# Patient Record
Sex: Female | Born: 1970 | Race: Black or African American | Hispanic: No | Marital: Single | Smoking: Current every day smoker
Health system: Southern US, Community
[De-identification: ages and names within clinical notes are randomized; demographics above are authoritative.]

## PROBLEM LIST (undated history)

## (undated) DIAGNOSIS — G47419 Narcolepsy without cataplexy: Secondary | ICD-10-CM

## (undated) DIAGNOSIS — F988 Other specified behavioral and emotional disorders with onset usually occurring in childhood and adolescence: Secondary | ICD-10-CM

## (undated) DIAGNOSIS — I10 Essential (primary) hypertension: Secondary | ICD-10-CM

## (undated) DIAGNOSIS — C801 Malignant (primary) neoplasm, unspecified: Secondary | ICD-10-CM

## (undated) HISTORY — PX: APPENDECTOMY: SHX54

## (undated) HISTORY — DX: Essential (primary) hypertension: I10

---

## 2010-11-14 HISTORY — PX: OTHER SURGICAL HISTORY: SHX169

## 2019-11-11 ENCOUNTER — Other Ambulatory Visit: Payer: Self-pay

## 2019-11-11 ENCOUNTER — Other Ambulatory Visit (HOSPITAL_COMMUNITY): Payer: Self-pay | Admitting: Psychiatry

## 2019-11-11 ENCOUNTER — Ambulatory Visit (INDEPENDENT_AMBULATORY_CARE_PROVIDER_SITE_OTHER): Payer: 59 | Admitting: Psychiatry

## 2019-11-11 DIAGNOSIS — F901 Attention-deficit hyperactivity disorder, predominantly hyperactive type: Secondary | ICD-10-CM | POA: Diagnosis not present

## 2019-11-11 DIAGNOSIS — F4310 Post-traumatic stress disorder, unspecified: Secondary | ICD-10-CM

## 2019-11-11 MED ORDER — SERTRALINE HCL 50 MG PO TABS: ORAL_TABLET | ORAL | 2 refills | Status: DC

## 2019-11-11 MED ORDER — AMPHETAMINE-DEXTROAMPHETAMINE 30 MG PO TABS: 30.0000 mg | ORAL_TABLET | Freq: Three times a day (TID) | ORAL | 0 refills | Status: DC
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
## 2019-11-25 ENCOUNTER — Ambulatory Visit (HOSPITAL_COMMUNITY): Payer: 59 | Admitting: Psychiatry

## 2019-11-25 ENCOUNTER — Other Ambulatory Visit: Payer: Self-pay

## 2019-12-09 ENCOUNTER — Ambulatory Visit (INDEPENDENT_AMBULATORY_CARE_PROVIDER_SITE_OTHER): Payer: 59 | Admitting: Psychiatry

## 2019-12-09 ENCOUNTER — Other Ambulatory Visit: Payer: Self-pay

## 2019-12-09 DIAGNOSIS — F901 Attention-deficit hyperactivity disorder, predominantly hyperactive type: Secondary | ICD-10-CM | POA: Diagnosis not present

## 2019-12-09 DIAGNOSIS — F4310 Post-traumatic stress disorder, unspecified: Secondary | ICD-10-CM | POA: Diagnosis not present

## 2019-12-09 MED ORDER — SERTRALINE HCL 50 MG PO TABS: ORAL_TABLET | ORAL | 1 refills | Status: DC

## 2019-12-09 MED ORDER — AMPHETAMINE-DEXTROAMPHETAMINE 30 MG PO TABS: 30.0000 mg | ORAL_TABLET | Freq: Three times a day (TID) | ORAL | 0 refills | Status: DC

## 2019-12-09 NOTE — Progress Notes (Signed)
BH MD/PA/NP OP Progress Note  12/09/2019 3:47 PM Toni Dougherty  MRN:  [id number]  Chief Complaint: Attention deficit hyperactivity disorder, posttraumatic stress disorder. HPI: Patient is seen and examined.  Patient is a 49 year old female with the above-stated past psychiatric history who is seen in follow-up.  This is a telephone visit today, of which greater than 50% of the time was spent on direct patient care and evaluation.  Patient stated that she is under an enormous amount of stress currently.  Her father is ill in [place], and it sounds like he is in end-of-life care.  Additionally her sister has been ill with an unspecified form of cancer, and now they have removed treatment.  This causes her great deal distress.  She stated that she is now "working into time zones".  She stated initially that the Zoloft had benefit, and was helping calm her down.  Since these issues have occurred with her family members it does not seem to be as effective.  With regard to the stimulants, she stated that previously she realized that she had been prescribed extended release Adderall, and that one of the reasons why she was so agitated was because of the massive dose of the stimulant she had been receiving.  She stated that the 30 mg a 3 times a day of the short acting Adderall was good, and she has been able to focus on her work and get things done.  She denied any major problems with her sleep from the medication.  She was a bit tearful today, but not suicidal.  This is sadness from the stress related to the fact that her family is not doing well, and that they are in [place], and with travel restrictions she is unable to go there to see them.  She denied any side effects to her current medications. Visit Diagnosis: No diagnosis found.  Past Psychiatric History: See initial evaluation.  Past Medical History: No past medical history on file.   Family Psychiatric History: See initial evaluation.  Family History:  No family history on file.  Social History:  Social History   Socioeconomic History  . Marital status: Single    Spouse name: Not on file  . Number of children: Not on file  . Years of education: Not on file  . Highest education level: Not on file  Occupational History  . Not on file  Tobacco Use  . Smoking status: Not on file  Substance and Sexual Activity  . Alcohol use: Not on file  . Drug use: Not on file  . Sexual activity: Not on file  Other Topics Concern  . Not on file  Social History Narrative  . Not on file   Social Determinants of Health   Financial Resource Strain:   . Difficulty of Paying Living Expenses: Not on file  Food Insecurity:   . Worried About Programme researcher, broadcasting/film/video in the Last Year: Not on file  . Ran Out of Food in the Last Year: Not on file  Transportation Needs:   . Lack of Transportation (Medical): Not on file  . Lack of Transportation (Non-Medical): Not on file  Physical Activity:   . Days of Exercise per Week: Not on file  . Minutes of Exercise per Session: Not on file  Stress:   . Feeling of Stress : Not on file  Social Connections:   . Frequency of Communication with Friends and Family: Not on file  . Frequency of Social Gatherings with Friends  and Family: Not on file  . Attends Religious Services: Not on file  . Active Member of Clubs or Organizations: Not on file  . Attends Archivist Meetings: Not on file  . Marital Status: Not on file    Allergies: Not on File  Metabolic Disorder Labs: No results found for: HGBA1C, MPG No results found for: PROLACTIN No results found for: CHOL, TRIG, HDL, CHOLHDL, VLDL, LDLCALC No results found for: TSH  Therapeutic Level Labs: No results found for: LITHIUM No results found for: VALPROATE No components found for:  CBMZ  Current Medications: Current Outpatient Medications  Medication Sig Dispense Refill  . amphetamine-dextroamphetamine (ADDERALL) 30 MG tablet Take 1 tablet by  mouth every 8 (eight) hours. 90 tablet 0  . sertraline (ZOLOFT) 50 MG tablet 1 tab po q day x 7 days then 1 1/2 tabs po q day 60 tablet 1   No current facility-administered medications for this visit.     Musculoskeletal: Strength & Muscle Tone: NA Gait & Station: NA Patient leans: N/A  Psychiatric Specialty Exam: Review of Systems  There were no vitals taken for this visit.There is no height or weight on file to calculate BMI.  General Appearance: NA  Eye Contact:  NA  Speech:  Normal Rate  Volume:  Normal  Mood:  Anxious and Depressed  Affect:  Congruent  Thought Process:  Coherent and Descriptions of Associations: Intact  Orientation:  Full (Time, Place, and Person)  Thought Content: Logical   Suicidal Thoughts:  No  Homicidal Thoughts:  No  Memory:  Immediate;   Good Recent;   Good Remote;   Fair  Judgement:  Intact  Insight:  Fair  Psychomotor Activity:  NA  Concentration:  Concentration: Fair and Attention Span: Fair  Recall:  [employer] of Knowledge: Fair  Language: Good  Akathisia:  Negative  Handed:  Right  AIMS (if indicated): not done  Assets:  Communication Skills Desire for Improvement Housing Resilience  ADL's:  Intact  Cognition: WNL  Sleep:  Fair   Screenings:   Assessment and Plan: Patient is seen and examined.  Patient is a 49 year old female with the above-stated past psychiatric history who is seen in follow-up.  Her depression and anxiety are worse, most presumably due to the family difficulties that are going on.  I will increase her dosage of Zoloft to 50 mg p.o. daily x7 days, then increase it to 75 mg p.o. daily.  We will see how she tolerates that.  She continues on Adderall 30 mg p.o. 3 times daily, and I will continue that dosage.  I did review the PMP database today, and my prescription is the only controlled substance she is currently receiving.  I will see her back in somewhere between 2 to 4 weeks given her family stressors.  1.   Continue Adderall 30 mg p.o. 3 times daily for attention and focus. 2.  Increase Zoloft to 50 mg p.o. daily for 7 days then increase to 75 mg p.o. daily for depression and anxiety. 3.  Will rerequest old records from her providers in [place]. 4.  Return to clinic in 2 to 4 weeks.   Sharma Covert, MD 12/09/2019, 3:47 PM

## 2019-12-20 ENCOUNTER — Telehealth (HOSPITAL_COMMUNITY): Payer: Self-pay | Admitting: *Deleted

## 2019-12-20 NOTE — Telephone Encounter (Signed)
Pharmacy request for prior authorization of Amphetamine-Dextroamphetamine 30mg . Submitted online with cover my meds. Approved with .

## 2019-12-24 ENCOUNTER — Telehealth (HOSPITAL_COMMUNITY): Payer: Self-pay

## 2019-12-24 NOTE — Telephone Encounter (Signed)
[employer] PRESCRIPTION COVERAGE APPROVED  AMPHETAMINE-DEXTROAMPHETAMINE 30MG  TABLET REFERENCE#  EFFECTIVE 12/20/2019 TO 12/19/2020

## 2020-01-02 ENCOUNTER — Other Ambulatory Visit (HOSPITAL_COMMUNITY): Payer: Self-pay | Admitting: Psychiatry

## 2020-01-02 MED ORDER — AMPHETAMINE-DEXTROAMPHETAMINE 30 MG PO TABS: 30.0000 mg | ORAL_TABLET | Freq: Three times a day (TID) | ORAL | 0 refills | Status: DC

## 2020-01-06 ENCOUNTER — Other Ambulatory Visit: Payer: Self-pay

## 2020-01-06 ENCOUNTER — Ambulatory Visit (INDEPENDENT_AMBULATORY_CARE_PROVIDER_SITE_OTHER): Payer: 59 | Admitting: Psychiatry

## 2020-01-06 DIAGNOSIS — F901 Attention-deficit hyperactivity disorder, predominantly hyperactive type: Secondary | ICD-10-CM

## 2020-01-06 DIAGNOSIS — F329 Major depressive disorder, single episode, unspecified: Secondary | ICD-10-CM

## 2020-01-06 DIAGNOSIS — F4310 Post-traumatic stress disorder, unspecified: Secondary | ICD-10-CM

## 2020-01-06 DIAGNOSIS — F419 Anxiety disorder, unspecified: Secondary | ICD-10-CM

## 2020-01-06 MED ORDER — SERTRALINE HCL 50 MG PO TABS: ORAL_TABLET | ORAL | 4 refills | Status: DC

## 2020-01-06 NOTE — Progress Notes (Signed)
BH MD/PA/NP OP Progress Note  01/06/2020 4:10 PM Toni Dougherty  MRN:  [id number]  Chief Complaint:  Attention deficit hyperactivity disorder, posttraumatic stress disorder. HPI: Patient is seen and examined.  Patient is a 49 year old female with the above-stated past psychiatric history who is seen in follow-up.  This is a telephone visit today, of which greater than 50% of the time was spent on direct patient care and evaluation.  Patient stated she is doing about 70% better than on her first evaluation.  Her father did die, and she is unable to go to [place] for the funeral, but she is going to be able to see the funeral by Zoom call.  She stated that she is having some tearfulness about the death of her father, but that her crying spells from the initial evaluation have decreased significantly.  She had some issues with getting the Adderall approved, but her insurance company approve that and she got the current dosage proximately a week or so ago.  She stated its helping significantly with her work.  She denied any helplessness, hopelessness or worthlessness.  She stated her focus and attention is doing well.  She denied any problems with sleep or other side effects to the stimulants.  She did state that she still just taking 50 mg of the Zoloft a day, and we discussed the possibility of increasing it to 75.  She did state that she had some mild nausea taking the Zoloft, but when she drank a good glass of water or 2 with it that the nausea resolved. Visit Diagnosis: No diagnosis found.  Past Psychiatric History: See initial evaluation.  Past Medical History: No past medical history on file.   Family Psychiatric History: See initial evaluation.  Family History: No family history on file.  Social History:  Social History   Socioeconomic History  . Marital status: Single    Spouse name: Not on file  . Number of children: Not on file  . Years of education: Not on file  . Highest education level:  Not on file  Occupational History  . Not on file  Tobacco Use  . Smoking status: Not on file  Substance and Sexual Activity  . Alcohol use: Not on file  . Drug use: Not on file  . Sexual activity: Not on file  Other Topics Concern  . Not on file  Social History Narrative  . Not on file   Social Determinants of Health   Financial Resource Strain:   . Difficulty of Paying Living Expenses: Not on file  Food Insecurity:   . Worried About Charity fundraiser in the Last Year: Not on file  . Ran Out of Food in the Last Year: Not on file  Transportation Needs:   . Lack of Transportation (Medical): Not on file  . Lack of Transportation (Non-Medical): Not on file  Physical Activity:   . Days of Exercise per Week: Not on file  . Minutes of Exercise per Session: Not on file  Stress:   . Feeling of Stress : Not on file  Social Connections:   . Frequency of Communication with Friends and Family: Not on file  . Frequency of Social Gatherings with Friends and Family: Not on file  . Attends Religious Services: Not on file  . Active Member of Clubs or Organizations: Not on file  . Attends Archivist Meetings: Not on file  . Marital Status: Not on file    Allergies: Not on File  Metabolic Disorder Labs: No results found for: HGBA1C, MPG No results found for: PROLACTIN No results found for: CHOL, TRIG, HDL, CHOLHDL, VLDL, LDLCALC No results found for: TSH  Therapeutic Level Labs: No results found for: LITHIUM No results found for: VALPROATE No components found for:  CBMZ  Current Medications: Current Outpatient Medications  Medication Sig Dispense Refill  . amphetamine-dextroamphetamine (ADDERALL) 30 MG tablet Take 1 tablet by mouth every 8 (eight) hours. 90 tablet 0  . sertraline (ZOLOFT) 50 MG tablet 1 1/2 tabs po q day 45 tablet 4   No current facility-administered medications for this visit.     Musculoskeletal: Strength & Muscle Tone: NA Gait & Station:  NA Patient leans: N/A  Psychiatric Specialty Exam: Review of Systems  Constitutional: Negative.   HENT: Negative.   Eyes: Negative.   Respiratory: Negative.   Cardiovascular: Negative.   Gastrointestinal: Positive for nausea.  Endocrine: Negative.   Genitourinary: Negative.   Musculoskeletal: Negative.   Skin: Negative.   Allergic/Immunologic: Negative.   Neurological: Negative.   Hematological: Negative.   Psychiatric/Behavioral: Positive for decreased concentration. The patient is nervous/anxious.   All other systems reviewed and are negative.   There were no vitals taken for this visit.There is no height or weight on file to calculate BMI.  General Appearance: NA  Eye Contact:  NA  Speech:  Normal Rate  Volume:  Normal  Mood:  Anxious  Affect:  Congruent  Thought Process:  Coherent  Orientation:  Full (Time, Place, and Person)  Thought Content: Logical   Suicidal Thoughts:  No  Homicidal Thoughts:  No  Memory:  Immediate;   Good Recent;   Good Remote;   Good  Judgement:  Intact  Insight:  Good  Psychomotor Activity:  NA  Concentration:  Concentration: Good and Attention Span: Good  Recall:  Good  Fund of Knowledge: Good  Language: Good  Akathisia:  Negative  Handed:  Right  AIMS (if indicated): not done  Assets:  Communication Skills Desire for Improvement Housing Resilience Talents/Skills  ADL's:  Intact  Cognition: WNL  Sleep:  Good   Screenings:   Assessment and Plan: Patient is seen and examined.  Patient is a 49 year old female with the above-stated past psychiatric history who is seen in follow-up.  This is a telephone visit today of which greater than 50% of the time was spent in direct patient care and evaluation.  She is actually coping fairly well with the death of her father.  She is having some degree of tearfulness, but her crying spells have decreased since starting the Zoloft..  She feels as though she is about 70% better than she was in the  first evaluation.  We discussed possibly increasing the Zoloft to 75 mg p.o. daily, number to go on and take care of that today.  She continues on the Adderall and doing much better than before.  Her previous provider had written for the extended release 30 mg 3 times a day which was a massive dosage.  She is tolerating the short acting much better.  She is not having any side effects outside of some mild nausea with the Zoloft.  No other changes to her treatment, and I will see her back somewhere between 3 to 4 months.  1.  Continue Adderall 30 mg p.o. 3 times daily for attention and focus. 2.  Increase Zoloft to 75 mg p.o. daily for anxiety and depression. 3.  Return to clinic in 3 to 4 months.  [name], MD 01/06/2020, 4:10 PM

## 2020-02-11 ENCOUNTER — Other Ambulatory Visit (HOSPITAL_COMMUNITY): Payer: Self-pay | Admitting: Psychiatry

## 2020-02-11 ENCOUNTER — Other Ambulatory Visit: Payer: Self-pay | Admitting: Psychiatry

## 2020-02-11 ENCOUNTER — Telehealth (HOSPITAL_COMMUNITY): Payer: Self-pay | Admitting: *Deleted

## 2020-02-11 MED ORDER — AMPHETAMINE-DEXTROAMPHETAMINE 30 MG PO TABS: 30.0000 mg | ORAL_TABLET | Freq: Three times a day (TID) | ORAL | 0 refills | Status: DC

## 2020-02-11 MED ORDER — SERTRALINE HCL 50 MG PO TABS: ORAL_TABLET | ORAL | 4 refills | Status: DC

## 2020-02-11 MED ORDER — SERTRALINE HCL 100 MG PO TABS: 150.0000 mg | ORAL_TABLET | Freq: Every day | ORAL | 4 refills | Status: DC

## 2020-02-11 NOTE — Telephone Encounter (Signed)
Received refill request for Adderall 30 mg and Zoloft 50mg . Last written 01/02/20. Pt has an appointment on 05/04/20. Please review.

## 2020-03-04 ENCOUNTER — Other Ambulatory Visit (HOSPITAL_COMMUNITY): Payer: Self-pay | Admitting: Psychiatry

## 2020-04-02 ENCOUNTER — Other Ambulatory Visit (HOSPITAL_COMMUNITY): Payer: Self-pay | Admitting: Psychiatry

## 2020-04-02 MED ORDER — AMPHETAMINE-DEXTROAMPHETAMINE 30 MG PO TABS: 30.0000 mg | ORAL_TABLET | Freq: Three times a day (TID) | ORAL | 0 refills | Status: DC

## 2020-04-29 ENCOUNTER — Other Ambulatory Visit: Payer: Self-pay

## 2020-04-29 ENCOUNTER — Encounter (HOSPITAL_COMMUNITY): Payer: Self-pay

## 2020-04-29 DIAGNOSIS — M791 Myalgia, unspecified site: Secondary | ICD-10-CM | POA: Insufficient documentation

## 2020-04-29 DIAGNOSIS — R05 Cough: Secondary | ICD-10-CM | POA: Insufficient documentation

## 2020-04-29 DIAGNOSIS — F17210 Nicotine dependence, cigarettes, uncomplicated: Secondary | ICD-10-CM | POA: Insufficient documentation

## 2020-04-29 DIAGNOSIS — J029 Acute pharyngitis, unspecified: Secondary | ICD-10-CM | POA: Insufficient documentation

## 2020-04-29 DIAGNOSIS — R5383 Other fatigue: Secondary | ICD-10-CM | POA: Diagnosis not present

## 2020-04-29 DIAGNOSIS — Z20822 Contact with and (suspected) exposure to covid-19: Secondary | ICD-10-CM | POA: Diagnosis not present

## 2020-04-29 DIAGNOSIS — R52 Pain, unspecified: Secondary | ICD-10-CM | POA: Insufficient documentation

## 2020-04-29 NOTE — ED Triage Notes (Signed)
Arrived POV. Patient reports flulike symptoms since Friday cough, headache. Patient states she has not taken any OTC meds at home "because I generally just don;t take medications". NAD

## 2020-04-30 ENCOUNTER — Emergency Department (HOSPITAL_COMMUNITY): Payer: 59

## 2020-04-30 ENCOUNTER — Emergency Department (HOSPITAL_COMMUNITY)
Admission: EM | Admit: 2020-04-30 | Discharge: 2020-04-30 | Disposition: A | Discharge status: Home / Self Care | Payer: 59 | Attending: Emergency Medicine | Admitting: Emergency Medicine

## 2020-04-30 DIAGNOSIS — R52 Pain, unspecified: Secondary | ICD-10-CM

## 2020-04-30 DIAGNOSIS — R059 Cough, unspecified: Secondary | ICD-10-CM

## 2020-04-30 LAB — SARS CORONAVIRUS 2 (TAT 6-24 HRS): SARS Coronavirus 2: NEGATIVE

## 2020-04-30 MED ORDER — BENZONATATE 100 MG PO CAPS
100.0000 mg | ORAL_CAPSULE | Freq: Three times a day (TID) | ORAL | 0 refills | Status: DC
Start: 2020-04-30 — End: 2022-09-23

## 2020-04-30 NOTE — ED Provider Notes (Signed)
[facility]-EMERGENCY DEPT Provider Note   CSN: [id number] Arrival date & time: 04/29/20  2144     History Chief Complaint  Patient presents with  . Flu symptoms    Toni Dougherty is a 49 y.o. female.  The history is provided by the patient and medical records.   50 year old female presenting to the ED with flulike symptoms.  She reports generalized body aches, headache, fatigue, sore throat, and productive cough for the past 3 days.  States sputum has been somewhat of a yellow color.  She is unsure of any sick contacts or known Covid exposures, she is currently living in a hotel room.  She reports intermittent fevers, afebrile here.  She has not tried any medications for her symptoms as she states "I does like taking medicine".  History reviewed. No pertinent past medical history.  There are no problems to display for this patient.   History reviewed. No pertinent surgical history.   OB History   No obstetric history on file.     No family history on file.  Social History   Tobacco Use  . Smoking status: Current Every Day Smoker    Packs/day: 0.25    Types: Cigarettes  . Smokeless tobacco: Never Used  Substance Use Topics  . Alcohol use: Yes  . Drug use: Not on file    Home Medications Prior to Admission medications   Medication Sig Start Date End Date Taking? Authorizing Provider  amphetamine-dextroamphetamine (ADDERALL) 30 MG tablet Take 1 tablet by mouth 3 (three) times daily. 04/02/20 04/02/21  [name], MD  sertraline (ZOLOFT) 100 MG tablet TAKE 1.5 TABLETS BY MOUTH DAILY 03/04/20   [name], MD    Allergies    Patient has no known allergies.  Review of Systems   Review of Systems  Constitutional: Positive for fatigue.  Respiratory: Positive for cough.   Musculoskeletal: Positive for myalgias.  All other systems reviewed and are negative.   Physical Exam Updated Vital Signs BP (!) 149/95 (BP Location: Left Arm)    Pulse 86   Temp 98.4 F (36.9 C) (Oral)   Resp 18   Ht 6' (1.829 m)   Wt 99.8 kg   LMP 04/28/2020   SpO2 96%   BMI 29.84 kg/m   Physical Exam Vitals and nursing note reviewed.  Constitutional:      Appearance: She is well-developed.  HENT:     Head: Normocephalic and atraumatic.  Eyes:     Conjunctiva/sclera: Conjunctivae normal.     Pupils: Pupils are equal, round, and reactive to light.  Cardiovascular:     Rate and Rhythm: Normal rate and regular rhythm.     Heart sounds: Normal heart sounds.  Pulmonary:     Effort: Pulmonary effort is normal. No respiratory distress.     Breath sounds: Normal breath sounds. No wheezing or rhonchi.     Comments: Lungs clear, no distress, able to speak in sentences without difficulty, no observed cough Abdominal:     General: Bowel sounds are normal.     Palpations: Abdomen is soft.     Tenderness: There is no abdominal tenderness. There is no rebound.  Musculoskeletal:        General: Normal range of motion.     Cervical back: Normal range of motion.  Skin:    General: Skin is warm and dry.  Neurological:     Mental Status: She is alert and oriented to person, place, and time.  ED Results / Procedures / Treatments   Labs (all labs ordered are listed, but only abnormal results are displayed) Labs Reviewed  SARS CORONAVIRUS 2 (TAT 6-24 HRS)    EKG None  Radiology DG Chest Port 1 View  Result Date: 04/30/2020 CLINICAL DATA:  Cough, fever EXAM: PORTABLE CHEST 1 VIEW COMPARISON:  None. FINDINGS: The heart size and mediastinal contours are within normal limits. Both lungs are clear. The visualized skeletal structures are unremarkable. IMPRESSION: No active disease. Electronically Signed   By: Prudencio Pair M.D.   On: 04/30/2020 02:55    Procedures Procedures (including critical care time)  Medications Ordered in ED Medications - No data to display  ED Course  I have reviewed the triage vital signs and the nursing  notes.  Pertinent labs & imaging results that were available during my care of the patient were reviewed by me and considered in my medical decision making (see chart for details).    MDM Rules/Calculators/A&P  49 year old female presenting to the ED with reported flulike symptoms over the past several days.  She reports headache, generalized body aches, sore throat, fatigue, and productive cough.  She is afebrile and nontoxic in appearance here.  She has no acute respiratory distress, lungs are clear bilaterally.  She reports she has been living in a hotel, unsure of sick contacts or noted Covid exposures.  Chest x-ray here without any acute findings.  Covid swab has been sent.  Patient advised to take Tylenol or Motrin as needed for headache/body aches/fever.  She was given Tessalon for cough.  She'll be notified if Covid test is positive, if so she'll need to quarantine for 14 days.  She can follow-up with PCP.  Return here for any new or acute changes.  Final Clinical Impression(s) / ED Diagnoses Final diagnoses:  Cough  Body aches    Rx / DC Orders ED Discharge Orders         Ordered    benzonatate (TESSALON) 100 MG capsule  Every 8 hours     Discontinue  Reprint     04/30/20 Kennedy, Fergie Sherbert M, PA-C 04/30/20 0435    Palumbo, April, MD 04/30/20 5102

## 2020-04-30 NOTE — Discharge Instructions (Signed)
Chest x-ray was normal.  You will be contacted if covid test is positive. Recommend tylenol or motrin for headache/fever/body aches.   Can take the tessalon for cough. Try to rest, drink lots of fluids. Return here for any new/acute changes.

## 2020-05-04 ENCOUNTER — Other Ambulatory Visit: Payer: Self-pay

## 2020-05-04 ENCOUNTER — Ambulatory Visit (HOSPITAL_COMMUNITY): Payer: 59 | Admitting: Psychiatry

## 2020-06-01 ENCOUNTER — Telehealth (HOSPITAL_COMMUNITY): Payer: 59 | Admitting: Psychiatry

## 2020-06-02 ENCOUNTER — Telehealth (HOSPITAL_COMMUNITY): Payer: 59 | Admitting: Psychiatry

## 2020-06-02 ENCOUNTER — Other Ambulatory Visit: Payer: Self-pay

## 2020-06-08 ENCOUNTER — Other Ambulatory Visit: Payer: Self-pay

## 2020-06-08 ENCOUNTER — Telehealth (HOSPITAL_COMMUNITY): Payer: 59 | Admitting: Psychiatry

## 2020-06-08 NOTE — Progress Notes (Unsigned)
BH MD/PA/NP OP Progress Note  06/08/2020 2:58 PM Toni Dougherty  MRN:  [id number]  Chief Complaint:  HPI: *** Visit Diagnosis: No diagnosis found.  Past Psychiatric History: ***  Past Medical History: No past medical history on file. No past surgical history on file.  Family Psychiatric History: ***  Family History: No family history on file.  Social History:  Social History   Socioeconomic History  . Marital status: Single    Spouse name: Not on file  . Number of children: Not on file  . Years of education: Not on file  . Highest education level: Not on file  Occupational History  . Not on file  Tobacco Use  . Smoking status: Current Every Day Smoker    Packs/day: 0.25    Types: Cigarettes  . Smokeless tobacco: Never Used  Substance and Sexual Activity  . Alcohol use: Yes  . Drug use: Not on file  . Sexual activity: Not on file  Other Topics Concern  . Not on file  Social History Narrative  . Not on file   Social Determinants of Health   Financial Resource Strain:   . Difficulty of Paying Living Expenses:   Food Insecurity:   . Worried About Programme researcher, broadcasting/film/video in the Last Year:   . Barista in the Last Year:   Transportation Needs:   . Freight forwarder (Medical):   Marland Kitchen Lack of Transportation (Non-Medical):   Physical Activity:   . Days of Exercise per Week:   . Minutes of Exercise per Session:   Stress:   . Feeling of Stress :   Social Connections:   . Frequency of Communication with Friends and Family:   . Frequency of Social Gatherings with Friends and Family:   . Attends Religious Services:   . Active Member of Clubs or Organizations:   . Attends Banker Meetings:   Marland Kitchen Marital Status:     Allergies: No Known Allergies  Metabolic Disorder Labs: No results found for: HGBA1C, MPG No results found for: PROLACTIN No results found for: CHOL, TRIG, HDL, CHOLHDL, VLDL, LDLCALC No results found for: TSH  Therapeutic Level  Labs: No results found for: LITHIUM No results found for: VALPROATE No components found for:  CBMZ  Current Medications: Current Outpatient Medications  Medication Sig Dispense Refill  . amphetamine-dextroamphetamine (ADDERALL) 30 MG tablet Take 1 tablet by mouth 3 (three) times daily. 90 tablet 0  . benzonatate (TESSALON) 100 MG capsule Take 1 capsule (100 mg total) by mouth every 8 (eight) hours. 21 capsule 0  . sertraline (ZOLOFT) 100 MG tablet TAKE 1.5 TABLETS BY MOUTH DAILY 135 tablet 2   No current facility-administered medications for this visit.     Musculoskeletal: Strength & Muscle Tone: {desc; muscle tone:32375} Gait & Station: {PE GAIT ED EUMP:53614} Patient leans: {Patient Leans:21022755}  Psychiatric Specialty Exam: Review of Systems  There were no vitals taken for this visit.There is no height or weight on file to calculate BMI.  General Appearance: {Appearance:22683}  Eye Contact:  {BHH EYE CONTACT:22684}  Speech:  {Speech:22685}  Volume:  {Volume (PAA):22686}  Mood:  {BHH MOOD:22306}  Affect:  {Affect (PAA):22687}  Thought Process:  {Thought Process (PAA):22688}  Orientation:  {BHH ORIENTATION (PAA):22689}  Thought Content: {Thought Content:22690}   Suicidal Thoughts:  {ST/HT (PAA):22692}  Homicidal Thoughts:  {ST/HT (PAA):22692}  Memory:  {BHH ERXVQM:08676}  Judgement:  {Judgement (PAA):22694}  Insight:  {Insight (PAA):22695}  Psychomotor Activity:  {Psychomotor (PAA):22696}  Concentration:  {Concentration:21399}  Recall:  {BHH GOOD/FAIR/POOR:22877}  Fund of Knowledge: {BHH GOOD/FAIR/POOR:22877}  Language: {BHH GOOD/FAIR/POOR:22877}  Akathisia:  {BHH YES OR NO:22294}  Handed:  {Handed:22697}  AIMS (if indicated): {Desc; done/not:10129}  Assets:  {Assets (PAA):22698}  ADL's:  {BHH DYJ'W:92957}  Cognition: {chl bhh cognition:304700322}  Sleep:  {BHH GOOD/FAIR/POOR:22877}   Screenings:   Assessment and Plan: ***   Toni Pert,  MD 06/08/2020, 2:58 PM

## 2020-06-26 ENCOUNTER — Emergency Department (HOSPITAL_COMMUNITY): Admission: EM | Admit: 2020-06-26 | Discharge: 2020-06-26 | Discharge status: Against Medical Advice | Payer: 59

## 2020-06-26 NOTE — ED Notes (Addendum)
PT left due to wait

## 2020-07-17 ENCOUNTER — Other Ambulatory Visit (HOSPITAL_COMMUNITY): Payer: Self-pay | Admitting: Psychiatry

## 2020-07-17 MED ORDER — SERTRALINE HCL 100 MG PO TABS: ORAL_TABLET | ORAL | 2 refills | Status: DC

## 2020-07-17 MED ORDER — AMPHETAMINE-DEXTROAMPHETAMINE 30 MG PO TABS: 30.0000 mg | ORAL_TABLET | Freq: Three times a day (TID) | ORAL | 0 refills | Status: DC

## 2020-07-21 ENCOUNTER — Other Ambulatory Visit: Payer: Self-pay

## 2020-07-21 ENCOUNTER — Telehealth (HOSPITAL_COMMUNITY): Payer: 59 | Admitting: Psychiatry

## 2020-07-22 ENCOUNTER — Other Ambulatory Visit: Payer: Self-pay

## 2020-07-22 ENCOUNTER — Telehealth (INDEPENDENT_AMBULATORY_CARE_PROVIDER_SITE_OTHER): Payer: 59 | Admitting: Psychiatry

## 2020-07-22 DIAGNOSIS — F900 Attention-deficit hyperactivity disorder, predominantly inattentive type: Secondary | ICD-10-CM

## 2020-07-22 NOTE — Progress Notes (Signed)
BH MD/PA/NP OP Progress Note  07/22/2020 2:16 PM Toni Dougherty  MRN:  [id number]  Chief Complaint: Attention deficit disorder, posttraumatic stress disorder HPI: Patient is seen and examined. Patient is a 49 year old female with the above-stated past psychiatric history who is seen in a telephone follow-up visit. This is a telephone visit today of which greater than 50% of the 20-minute visit was spent on direct patient care and evaluation. A 2 factor identification process was done. The patient was located in her home, and I was in my office at the hospital. It has been 6 months since she was seen in follow-up. Since that time her father died, her sister died, she was left homeless. That was all 1 to 2 months ago. She now has a home to live then, she is able to pay her rent, and she is still working. She stated she feels like she has to make her life for crisis before she can accomplish anything. Procrastination seems to be a great problem for her. She stated she felt as though the Zoloft was helpful with regard to controlling the anxiety and depression. She stated the stimulants which were restarted 2 to 3 days ago help her focus on accomplishing tasks. She stated she felt as though her previous trauma made her have significant negative self image, and that she was sabotaging herself through all these events. Overall she stated she feels much better now that she had, she denied any side effects to her current medications, and no evidence of any suicidal thinking. 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 Visit Diagnosis: No diagnosis found.  Past Psychiatric History: See initial evaluation.  Past Medical History: No past medical history on file. No past surgical history on file.  Family Psychiatric History: See initial evaluation.  Family History: No family history on file.  Social History:  Social History   Socioeconomic  History   Marital status: Single    Spouse name: Not on file   Number of children: Not on file   Years of education: Not on file   Highest education level: Not on file  Occupational History   Not on file  Tobacco Use   Smoking status: Current Every Day Smoker    Packs/day: 0.25    Types: Cigarettes   Smokeless tobacco: Never Used  Substance and Sexual Activity   Alcohol use: Yes   Drug use: Not on file   Sexual activity: Not on file  Other Topics Concern   Not on file  Social History Narrative   Not on file   Social Determinants of Health   Financial Resource Strain:    Difficulty of Paying Living Expenses: Not on file  Food Insecurity:    Worried About Running Out of Food in the Last Year: Not on file   Ran Out of Food in the Last Year: Not on file  Transportation Needs:    Lack of Transportation (Medical): Not on file   Lack of Transportation (Non-Medical): Not on file  Physical Activity:    Days of Exercise per Week: Not on file   Minutes of Exercise per Session: Not on file  Stress:    Feeling of Stress : Not on file  Social Connections:    Frequency of Communication with Friends and Family: Not on file   Frequency of Social Gatherings with Friends and Family: Not on file   Attends Religious Services: Not on file   Active Member  of Clubs or Organizations: Not on file   Attends Banker Meetings: Not on file   Marital Status: Not on file    Allergies: No Known Allergies  Metabolic Disorder Labs: No results found for: HGBA1C, MPG No results found for: PROLACTIN No results found for: CHOL, TRIG, HDL, CHOLHDL, VLDL, LDLCALC No results found for: TSH  Therapeutic Level Labs: No results found for: LITHIUM No results found for: VALPROATE No components found for:  CBMZ  Current Medications: Current Outpatient Medications  Medication Sig Dispense Refill   amphetamine-dextroamphetamine (ADDERALL) 30 MG tablet Take 1 tablet  by mouth 3 (three) times daily. 90 tablet 0   benzonatate (TESSALON) 100 MG capsule Take 1 capsule (100 mg total) by mouth every 8 (eight) hours. 21 capsule 0   sertraline (ZOLOFT) 100 MG tablet TAKE 1.5 TABLETS BY MOUTH DAILY 135 tablet 2   No current facility-administered medications for this visit.     Musculoskeletal: Strength & Muscle Tone: NA Gait & Station: NA Patient leans: N/A  Psychiatric Specialty Exam: Review of Systems  There were no vitals taken for this visit.There is no height or weight on file to calculate BMI.  General Appearance: NA  Eye Contact:  NA  Speech:  Normal Rate  Volume:  Normal  Mood:  Anxious  Affect:  Congruent  Thought Process:  Coherent and Descriptions of Associations: Intact  Orientation:  Full (Time, Place, and Person)  Thought Content: Logical   Suicidal Thoughts:  No  Homicidal Thoughts:  No  Memory:  Immediate;   Good Recent;   Good Remote;   Good  Judgement:  Intact  Insight:  Fair  Psychomotor Activity:  NA  Concentration:  Concentration: Fair and Attention Span: Fair  Recall:  Good  Fund of Knowledge: Good  Language: Good  Akathisia:  Negative  Handed:  Right  AIMS (if indicated): not done  Assets:  Communication Skills Desire for Improvement Housing Resilience Talents/Skills Vocational/Educational  ADL's:  Intact  Cognition: WNL  Sleep:  Good   Screenings:  Diagnosis: 1. Posttraumatic stress disorder 2. Attention deficit hyperactivity disorder 3. Major depression  Assessment and Plan: Patient is seen and examined. Patient is a 49 year old female with the above-stated past psychiatric history who was seen in follow-up. She was lost to follow-up for several months because of being homeless, but now has a place to stay, is working, and doing much better. No change in her current medications. I will see her back in approximately 3 to 4 months.  Plan: 1. Continue sertraline 75 mg p.o. daily for depression, anxiety  and previous trauma. 2. Continue Adderall 30 mg p.o. twice daily for attention deficit hyperactivity disorder. 3. Return to clinic in 3 to 4 months.   [name], MD 07/22/2020, 2:16 PM

## 2020-08-25 ENCOUNTER — Other Ambulatory Visit (HOSPITAL_COMMUNITY): Payer: Self-pay | Admitting: Psychiatry

## 2020-08-25 MED ORDER — AMPHETAMINE-DEXTROAMPHETAMINE 30 MG PO TABS: 30.0000 mg | ORAL_TABLET | Freq: Three times a day (TID) | ORAL | 0 refills | Status: DC

## 2020-09-28 ENCOUNTER — Other Ambulatory Visit (HOSPITAL_COMMUNITY): Payer: Self-pay | Admitting: Psychiatry

## 2020-09-28 MED ORDER — AMPHETAMINE-DEXTROAMPHETAMINE 30 MG PO TABS: 30.0000 mg | ORAL_TABLET | Freq: Three times a day (TID) | ORAL | 0 refills | Status: DC

## 2020-10-22 ENCOUNTER — Other Ambulatory Visit (HOSPITAL_COMMUNITY): Payer: Self-pay | Admitting: Psychiatry

## 2020-10-22 MED ORDER — AMPHETAMINE-DEXTROAMPHETAMINE 30 MG PO TABS: 30.0000 mg | ORAL_TABLET | Freq: Three times a day (TID) | ORAL | 0 refills | Status: DC

## 2020-10-28 ENCOUNTER — Other Ambulatory Visit (HOSPITAL_COMMUNITY): Payer: Self-pay | Admitting: Psychiatry

## 2020-10-28 MED ORDER — AMPHETAMINE-DEXTROAMPHETAMINE 30 MG PO TABS: 30.0000 mg | ORAL_TABLET | Freq: Three times a day (TID) | ORAL | 0 refills | Status: DC

## 2021-01-29 ENCOUNTER — Telehealth (HOSPITAL_COMMUNITY): Payer: Self-pay | Admitting: *Deleted

## 2021-01-29 ENCOUNTER — Other Ambulatory Visit (HOSPITAL_COMMUNITY): Payer: Self-pay | Admitting: Psychiatry

## 2021-01-29 MED ORDER — SERTRALINE HCL 100 MG PO TABS: ORAL_TABLET | ORAL | 2 refills | Status: DC

## 2021-01-29 MED ORDER — AMPHETAMINE-DEXTROAMPHETAMINE 30 MG PO TABS: 30.0000 mg | ORAL_TABLET | Freq: Three times a day (TID) | ORAL | 0 refills | Status: DC

## 2021-01-29 NOTE — Telephone Encounter (Signed)
Pt called requesting refill of Adderall. Pt has no appointment on the books. Will call with reminder.

## 2021-07-07 IMAGING — DX DG CHEST 1V PORT
1 series · 2 of 2 positions shown · non-contrast
Comparison: None.

CLINICAL DATA: Cough, fever

EXAM:
PORTABLE CHEST 1 VIEW

[Series 1: chest ap · 0.14mm/px · 2 of 2 slices shown]
[im 1/2]
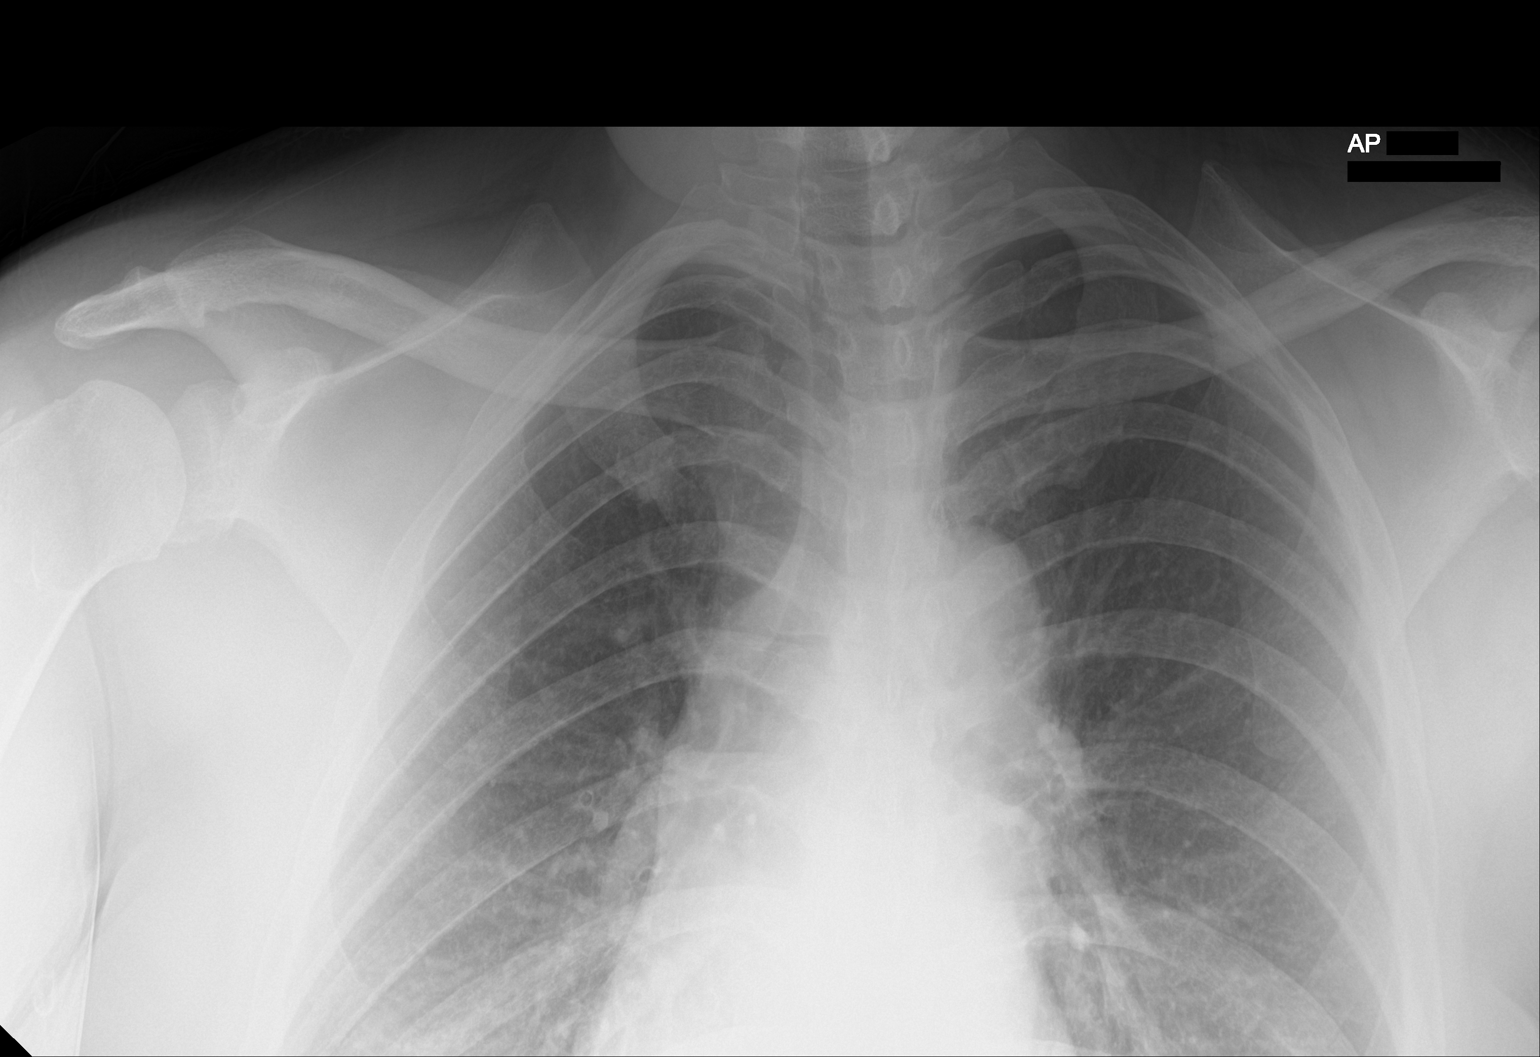
[im 2/2]
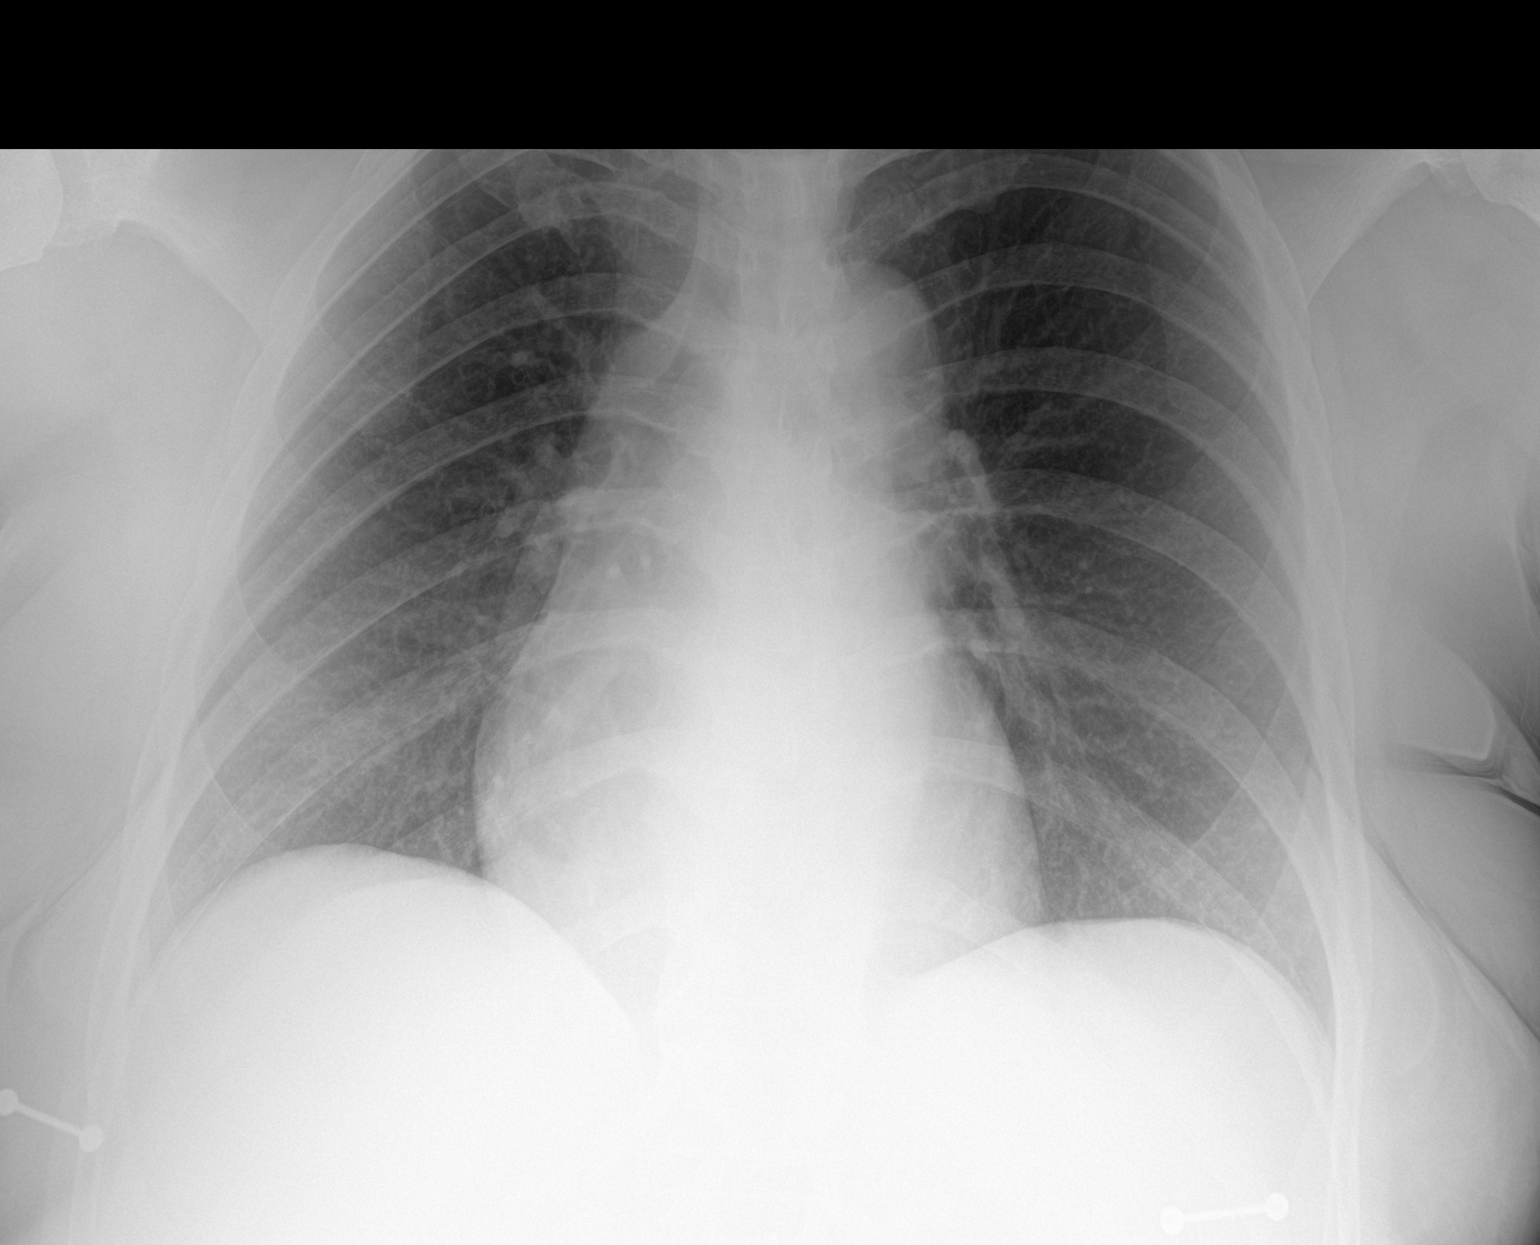

[2 of 2 positions shown; findings below may reference images not displayed]

FINDINGS: The heart size and mediastinal contours are within normal limits.
Both lungs are clear. The visualized skeletal structures are
unremarkable.
IMPRESSION: No active disease.

## 2021-11-06 ENCOUNTER — Other Ambulatory Visit: Payer: Self-pay

## 2021-11-06 ENCOUNTER — Emergency Department (HOSPITAL_BASED_OUTPATIENT_CLINIC_OR_DEPARTMENT_OTHER)
Admission: EM | Admit: 2021-11-06 | Discharge: 2021-11-07 | Disposition: A | Discharge status: Home / Self Care | Payer: Self-pay | Attending: Emergency Medicine | Admitting: Emergency Medicine

## 2021-11-06 ENCOUNTER — Encounter (HOSPITAL_BASED_OUTPATIENT_CLINIC_OR_DEPARTMENT_OTHER): Payer: Self-pay | Admitting: *Deleted

## 2021-11-06 ENCOUNTER — Emergency Department (HOSPITAL_BASED_OUTPATIENT_CLINIC_OR_DEPARTMENT_OTHER): Payer: Self-pay

## 2021-11-06 DIAGNOSIS — Z28310 Unvaccinated for covid-19: Secondary | ICD-10-CM | POA: Insufficient documentation

## 2021-11-06 DIAGNOSIS — H66001 Acute suppurative otitis media without spontaneous rupture of ear drum, right ear: Secondary | ICD-10-CM

## 2021-11-06 DIAGNOSIS — N390 Urinary tract infection, site not specified: Secondary | ICD-10-CM

## 2021-11-06 DIAGNOSIS — B9689 Other specified bacterial agents as the cause of diseases classified elsewhere: Secondary | ICD-10-CM | POA: Insufficient documentation

## 2021-11-06 DIAGNOSIS — F17210 Nicotine dependence, cigarettes, uncomplicated: Secondary | ICD-10-CM | POA: Insufficient documentation

## 2021-11-06 DIAGNOSIS — U071 COVID-19: Secondary | ICD-10-CM

## 2021-11-06 HISTORY — DX: Other specified behavioral and emotional disorders with onset usually occurring in childhood and adolescence: F98.8

## 2021-11-06 HISTORY — DX: Narcolepsy without cataplexy: G47.419

## 2021-11-06 LAB — URINALYSIS, MICROSCOPIC (REFLEX)

## 2021-11-06 LAB — CBC WITH DIFFERENTIAL/PLATELET
Abs Immature Granulocytes: 0.05 10*3/uL (ref 0.00–0.07)
Basophils Absolute: 0 10*3/uL (ref 0.0–0.1)
Basophils Relative: 1 %
Eosinophils Absolute: 0.1 10*3/uL (ref 0.0–0.5)
Eosinophils Relative: 1 %
HCT: 38 % (ref 36.0–46.0)
Hemoglobin: 12.9 g/dL (ref 12.0–15.0)
Immature Granulocytes: 1 %
Lymphocytes Relative: 6 %
Lymphs Abs: 0.4 10*3/uL — ABNORMAL LOW (ref 0.7–4.0)
MCH: 31 pg (ref 26.0–34.0)
MCHC: 33.9 g/dL (ref 30.0–36.0)
MCV: 91.3 fL (ref 80.0–100.0)
Monocytes Absolute: 0.7 10*3/uL (ref 0.1–1.0)
Monocytes Relative: 10 %
Neutro Abs: 6.1 10*3/uL (ref 1.7–7.7)
Neutrophils Relative %: 81 %
Platelets: 203 10*3/uL (ref 150–400)
RBC: 4.16 MIL/uL (ref 3.87–5.11)
RDW: 13.2 % (ref 11.5–15.5)
WBC: 7.4 10*3/uL (ref 4.0–10.5)
nRBC: 0 % (ref 0.0–0.2)

## 2021-11-06 LAB — URINALYSIS, ROUTINE W REFLEX MICROSCOPIC
Bilirubin Urine: NEGATIVE
Glucose, UA: NEGATIVE mg/dL
Ketones, ur: NEGATIVE mg/dL
Leukocytes,Ua: NEGATIVE
Nitrite: NEGATIVE
Protein, ur: NEGATIVE mg/dL
Specific Gravity, Urine: 1.02 (ref 1.005–1.030)
pH: 7 (ref 5.0–8.0)

## 2021-11-06 LAB — COMPREHENSIVE METABOLIC PANEL
ALT: 14 U/L (ref 0–44)
AST: 19 U/L (ref 15–41)
Albumin: 3.3 g/dL — ABNORMAL LOW (ref 3.5–5.0)
Alkaline Phosphatase: 54 U/L (ref 38–126)
Anion gap: 9 (ref 5–15)
BUN: 9 mg/dL (ref 6–20)
CO2: 21 mmol/L — ABNORMAL LOW (ref 22–32)
Calcium: 8.1 mg/dL — ABNORMAL LOW (ref 8.9–10.3)
Chloride: 104 mmol/L (ref 98–111)
Creatinine, Ser: 1.08 mg/dL — ABNORMAL HIGH (ref 0.44–1.00)
GFR, Estimated: >60 mL/min (ref >60)
Glucose, Bld: 104 mg/dL — ABNORMAL HIGH (ref 70–99)
Potassium: 3.8 mmol/L (ref 3.5–5.1)
Sodium: 134 mmol/L — ABNORMAL LOW (ref 135–145)
Total Bilirubin: 0.3 mg/dL (ref 0.3–1.2)
Total Protein: 6.8 g/dL (ref 6.5–8.1)

## 2021-11-06 LAB — LACTIC ACID, PLASMA: Lactic Acid, Venous: 1.8 mmol/L (ref 0.5–1.9)

## 2021-11-06 LAB — RESP PANEL BY RT-PCR (FLU A&B, COVID) ARPGX2
Influenza A by PCR: NEGATIVE
Influenza B by PCR: NEGATIVE
SARS Coronavirus 2 by RT PCR: POSITIVE — AB

## 2021-11-06 LAB — PREGNANCY, URINE: Preg Test, Ur: NEGATIVE

## 2021-11-06 MED ORDER — PROCHLORPERAZINE EDISYLATE 10 MG/2ML IJ SOLN
10.0000 mg | Freq: Once | INTRAMUSCULAR | Status: AC
  Administered 2021-11-06: 23:00:00 10 mg via INTRAVENOUS
  Filled 2021-11-06: qty 2

## 2021-11-06 MED ORDER — DIPHENHYDRAMINE HCL 50 MG/ML IJ SOLN
25.0000 mg | Freq: Once | INTRAMUSCULAR | Status: AC
  Administered 2021-11-06: 23:00:00 25 mg via INTRAVENOUS
  Filled 2021-11-06: qty 1

## 2021-11-06 MED ORDER — KETOROLAC TROMETHAMINE 30 MG/ML IJ SOLN
30.0000 mg | Freq: Once | INTRAMUSCULAR | Status: AC
  Administered 2021-11-07: 30 mg via INTRAVENOUS
  Filled 2021-11-06: qty 1

## 2021-11-06 MED ORDER — ACETAMINOPHEN 325 MG PO TABS: 650.0000 mg | ORAL_TABLET | Freq: Once | ORAL | Status: DC

## 2021-11-06 MED ORDER — LACTATED RINGERS IV BOLUS
1000.0000 mL | Freq: Once | INTRAVENOUS | Status: AC
  Administered 2021-11-06: 21:00:00 1000 mL via INTRAVENOUS

## 2021-11-06 NOTE — ED Provider Notes (Signed)
[facility] EMERGENCY DEPARTMENT Provider Note   CSN: [id number] Arrival date & time: 11/06/21  2024     History Chief Complaint  Patient presents with   Fever    Near syncope    Toni Dougherty is a 50 y.o. female.  HPI      This afternoon had some symptoms, went to store and got BC powder then began to feel very sick Body aches, nausea, vomiting, headache, rocking because of nausea and chills.  Not aware of fever PTA but was having chills and 103 with EMS Cough, congestion also started today, sore throat Stomach on tender side but not having focal pain, whole thing uncomfortable Nausea the worst part Headache No chest pain or shortness of breath Had syncopal episode, was feeling chills and cold before that  Work at a hotel front desk Not vaccinated against covid or flu  Past Medical History:  Diagnosis Date   ADD (attention deficit disorder)    Narcolepsy     There are no problems to display for this patient.   Past Surgical History:  Procedure Laterality Date   APPENDECTOMY       OB History   No obstetric history on file.     No family history on file.  Social History   Tobacco Use   Smoking status: Every Day    Packs/day: 0.25    Types: Cigarettes   Smokeless tobacco: Never  Vaping Use   Vaping Use: Never used  Substance Use Topics   Alcohol use: Yes   Drug use: Not Currently    Home Medications Prior to Admission medications   Medication Sig Start Date End Date Taking? Authorizing Provider  cephALEXin (KEFLEX) 500 MG capsule Take 1 capsule (500 mg total) by mouth 3 (three) times daily for 7 days. 11/07/21 11/14/21 Yes Alvira Monday, MD  cyclobenzaprine (FLEXERIL) 10 MG tablet Take 1 tablet (10 mg total) by mouth 2 (two) times daily as needed for muscle spasms. 11/07/21  Yes Alvira Monday, MD  nirmatrelvir/ritonavir EUA (PAXLOVID) 20 x 150 MG & 10 x 100MG  TABS Take 3 tablets by mouth 2 (two) times daily for 5 days. Patient GFR is  60. Take nirmatrelvir (150 mg) two tablets twice daily for 5 days and ritonavir (100 mg) one tablet twice daily for 5 days. 11/07/21 11/12/21 Yes 11/14/21, MD  ondansetron (ZOFRAN-ODT) 4 MG disintegrating tablet Take 1 tablet (4 mg total) by mouth every 8 (eight) hours as needed for nausea or vomiting. 11/06/21  Yes 11/08/21, MD  amphetamine-dextroamphetamine (ADDERALL) 30 MG tablet Take 1 tablet by mouth 3 (three) times daily. 01/29/21 01/29/22  01/31/22, MD  benzonatate (TESSALON) 100 MG capsule Take 1 capsule (100 mg total) by mouth every 8 (eight) hours. 04/30/20   05/02/20, PA-C  sertraline (ZOLOFT) 100 MG tablet TAKE 1.5 TABLETS BY MOUTH DAILY 01/29/21   01/31/21, MD    Allergies    Erythromycin and Penicillins  Review of Systems   Review of Systems  Constitutional:  Positive for appetite change, chills, fatigue and fever.  HENT:  Positive for congestion, ear pain, rhinorrhea and sore throat.   Eyes:  Negative for visual disturbance.  Respiratory:  Positive for cough. Negative for shortness of breath.   Cardiovascular:  Negative for chest pain.  Gastrointestinal:  Positive for abdominal pain, diarrhea, nausea and vomiting. Negative for constipation.  Genitourinary:  Negative for difficulty urinating and dysuria.  Musculoskeletal:  Positive for arthralgias and myalgias.  Negative for back pain.  Skin:  Positive for rash. Negative for wound.  Neurological:  Positive for headaches.   Physical Exam Updated Vital Signs BP (!) 144/98 (BP Location: Left Arm)    Pulse 76    Temp 100.3 F (37.9 C) (Oral)    Resp 19    Ht 6' (1.829 m)    Wt 90.7 kg    LMP 10/28/2021    SpO2 98%    BMI 27.12 kg/m   Physical Exam Vitals and nursing note reviewed.  Constitutional:      General: She is not in acute distress.    Appearance: She is well-developed. She is ill-appearing. She is not diaphoretic.  HENT:     Head: Normocephalic and atraumatic.     Comments:  Right ear TM bulging/effusion Eyes:     Conjunctiva/sclera: Conjunctivae normal.  Cardiovascular:     Rate and Rhythm: Normal rate and regular rhythm.     Heart sounds: Normal heart sounds. No murmur heard.   No friction rub. No gallop.  Pulmonary:     Effort: Pulmonary effort is normal. No respiratory distress.     Breath sounds: Normal breath sounds. No wheezing or rales.  Abdominal:     General: There is no distension.     Palpations: Abdomen is soft.     Tenderness: There is no abdominal tenderness. There is no guarding.  Musculoskeletal:        General: No tenderness.     Cervical back: Normal range of motion.  Skin:    General: Skin is warm and dry.     Findings: No erythema or rash.  Neurological:     Mental Status: She is alert and oriented to person, place, and time.    ED Results / Procedures / Treatments   Labs (all labs ordered are listed, but only abnormal results are displayed) Labs Reviewed  RESP PANEL BY RT-PCR (FLU A&B, COVID) ARPGX2 - Abnormal; Notable for the following components:      Result Value   SARS Coronavirus 2 by RT PCR POSITIVE (*)    All other components within normal limits  CBC WITH DIFFERENTIAL/PLATELET - Abnormal; Notable for the following components:   Lymphs Abs 0.4 (*)    All other components within normal limits  COMPREHENSIVE METABOLIC PANEL - Abnormal; Notable for the following components:   Sodium 134 (*)    CO2 21 (*)    Glucose, Bld 104 (*)    Creatinine, Ser 1.08 (*)    Calcium 8.1 (*)    Albumin 3.3 (*)    All other components within normal limits  URINALYSIS, ROUTINE W REFLEX MICROSCOPIC - Abnormal; Notable for the following components:   APPearance HAZY (*)    Hgb urine dipstick LARGE (*)    All other components within normal limits  URINALYSIS, MICROSCOPIC (REFLEX) - Abnormal; Notable for the following components:   Bacteria, UA MANY (*)    All other components within normal limits  LACTIC ACID, PLASMA  PREGNANCY,  URINE    EKG EKG Interpretation  Date/Time:  Saturday November 06 2021 21:06:13 EST Ventricular Rate:  95 PR Interval:  123 QRS Duration: 68 QT Interval:  304 QTC Calculation: 383 R Axis:   58 Text Interpretation: Sinus rhythm Borderline repolarization abnormality No previous ECGs available Confirmed by Gareth Morgan [phone]) on 11/06/2021 10:54:45 PM  Radiology DG Chest Portable 1 View  Result Date: 11/06/2021 CLINICAL DATA:  Febrile common nausea, vomiting and body aches. Cough EXAM: PORTABLE  CHEST 1 VIEW COMPARISON:  April 30, 2020 FINDINGS: The heart size and mediastinal contours are within normal limits. No focal consolidation. No visible pleural effusion or pneumothorax. The visualized skeletal structures are unremarkable. IMPRESSION: No active disease. Electronically Signed   By: Dahlia Bailiff M.D.   On: 11/06/2021 22:09    Procedures Procedures   Medications Ordered in ED Medications  lactated ringers bolus 1,000 mL ( Intravenous Stopped 11/06/21 2235)  prochlorperazine (COMPAZINE) injection 10 mg (10 mg Intravenous Given 11/06/21 2237)  diphenhydrAMINE (BENADRYL) injection 25 mg (25 mg Intravenous Given 11/06/21 2237)  ketorolac (TORADOL) 30 MG/ML injection 30 mg (30 mg Intravenous Given 11/07/21 0010)    ED Course  I have reviewed the triage vital signs and the nursing notes.  Pertinent labs & imaging results that were available during my care of the patient were reviewed by me and considered in my medical decision making (see chart for details).    MDM Rules/Calculators/A&P                         50 year old female with a history of ADD and narcolepsy presents with concern for fever, body aches, cough, congestion, headache, nausea and syncopal episode. EKG without acute findings.  Pregnancy has negative.  Labs without clinically significant electrolyte abnormalities.  No leukocytosis or signs of anemia.  Lactic acid within normal limits.  Chest x-ray shows no  evidence of pneumonia, pulmonary edema or other acute abnormalities.  Did not have chest pain or shortness of breath prior to syncopal episode.  Feel syncopal episode likely secondary to generalized feeling unwell, vasovagal and dehydration in setting of viral syndrome.  COVID-19 testing returned positive.  Given patient is unvaccinated, offered paxlovid.  Urinalysis shows many bacteria, and right ear showed signs of infection, and will give prescription for Keflex for possibility of these bacterial infections. Given rx for zofran, flexeril.  Given compazine/benadryl/toradol/fluids in the ED with improvement in symptoms. Patient discharged in stable condition with understanding of reasons to return.      Final Clinical Impression(s) / ED Diagnoses Final diagnoses:  [id number]  Urinary tract infection without hematuria, site unspecified  Acute suppurative otitis media of right ear without spontaneous rupture of tympanic membrane, recurrence not specified    Rx / DC Orders ED Discharge Orders          Ordered    nirmatrelvir/ritonavir EUA (PAXLOVID) 20 x 150 MG & 10 x 100MG  TABS  2 times daily        11/07/21 0001    cyclobenzaprine (FLEXERIL) 10 MG tablet  2 times daily PRN        11/07/21 0001    cephALEXin (KEFLEX) 500 MG capsule  3 times daily        11/07/21 0001    ondansetron (ZOFRAN-ODT) 4 MG disintegrating tablet  Every 8 hours PRN        11/07/21 0001             Gareth Morgan, MD 11/07/21 1556

## 2021-11-06 NOTE — ED Triage Notes (Addendum)
EMS transport from work. VS: HR 86; BP 164/68, 97% RA, RR 18, temporal temperature was 103, CBG 134. 1 gram tylenol given by EMS. C/o n/v and body aches. Per EMS pt's coworkers state "collapsed" but able to stand and ambulate after. 22g piv in Left hand. 4mg  zofran given pta

## 2021-11-07 MED ORDER — ONDANSETRON 4 MG PO TBDP
4.0000 mg | ORAL_TABLET | Freq: Three times a day (TID) | ORAL | 0 refills | Status: DC | PRN
Start: 2021-11-06 — End: 2022-09-23

## 2021-11-07 MED ORDER — NIRMATRELVIR/RITONAVIR (PAXLOVID)TABLET: 3.0000 | ORAL_TABLET | Freq: Two times a day (BID) | ORAL | 0 refills | Status: AC

## 2021-11-07 MED ORDER — CEPHALEXIN 500 MG PO CAPS
500.0000 mg | ORAL_CAPSULE | Freq: Three times a day (TID) | ORAL | 0 refills | Status: AC
Start: 2021-11-07 — End: 2021-11-14

## 2021-11-07 MED ORDER — CYCLOBENZAPRINE HCL 10 MG PO TABS
10.0000 mg | ORAL_TABLET | Freq: Two times a day (BID) | ORAL | 0 refills | Status: DC | PRN
Start: 2021-11-07 — End: 2022-09-23

## 2022-09-23 ENCOUNTER — Encounter: Payer: Self-pay | Admitting: Internal Medicine

## 2022-09-23 ENCOUNTER — Ambulatory Visit: Payer: BC Managed Care – PPO | Admitting: Internal Medicine

## 2022-09-23 VITALS — BP 144/96 | HR 68 | Temp 98.8°F | Resp 16 | Ht 71.0 in | Wt 242.0 lb

## 2022-09-23 DIAGNOSIS — Z0001 Encounter for general adult medical examination with abnormal findings: Secondary | ICD-10-CM

## 2022-09-23 DIAGNOSIS — Z720 Tobacco use: Secondary | ICD-10-CM

## 2022-09-23 DIAGNOSIS — E6609 Other obesity due to excess calories: Secondary | ICD-10-CM | POA: Diagnosis not present

## 2022-09-23 DIAGNOSIS — Z1211 Encounter for screening for malignant neoplasm of colon: Secondary | ICD-10-CM

## 2022-09-23 DIAGNOSIS — A5903 Trichomonal cystitis and urethritis: Secondary | ICD-10-CM | POA: Insufficient documentation

## 2022-09-23 DIAGNOSIS — Z1159 Encounter for screening for other viral diseases: Secondary | ICD-10-CM

## 2022-09-23 DIAGNOSIS — E66811 Obesity, class 1: Secondary | ICD-10-CM

## 2022-09-23 DIAGNOSIS — I10 Essential (primary) hypertension: Secondary | ICD-10-CM | POA: Diagnosis not present

## 2022-09-23 DIAGNOSIS — Z1231 Encounter for screening mammogram for malignant neoplasm of breast: Secondary | ICD-10-CM | POA: Insufficient documentation

## 2022-09-23 DIAGNOSIS — I119 Hypertensive heart disease without heart failure: Secondary | ICD-10-CM | POA: Diagnosis not present

## 2022-09-23 DIAGNOSIS — Z6833 Body mass index (BMI) 33.0-33.9, adult: Secondary | ICD-10-CM

## 2022-09-23 LAB — URINALYSIS, ROUTINE W REFLEX MICROSCOPIC
Ketones, ur: NEGATIVE
Nitrite: NEGATIVE
Specific Gravity, Urine: 1.025 (ref 1.000–1.030)
Total Protein, Urine: 30 — AB
Urine Glucose: NEGATIVE
Urobilinogen, UA: 0.2 (ref 0.0–1.0)
pH: 6 (ref 5.0–8.0)

## 2022-09-23 LAB — CBC WITH DIFFERENTIAL/PLATELET
Basophils Absolute: 0.1 10*3/uL (ref 0.0–0.1)
Basophils Relative: 1 % (ref 0.0–3.0)
Eosinophils Absolute: 0.1 10*3/uL (ref 0.0–0.7)
Eosinophils Relative: 1.6 % (ref 0.0–5.0)
HCT: 45.4 % (ref 36.0–46.0)
Hemoglobin: 14.9 g/dL (ref 12.0–15.0)
Lymphocytes Relative: 26.6 % (ref 12.0–46.0)
Lymphs Abs: 2.6 10*3/uL (ref 0.7–4.0)
MCHC: 32.9 g/dL (ref 30.0–36.0)
MCV: 92.5 fl (ref 78.0–100.0)
Monocytes Absolute: 0.4 10*3/uL (ref 0.1–1.0)
Monocytes Relative: 4.5 % (ref 3.0–12.0)
Neutro Abs: 6.4 10*3/uL (ref 1.4–7.7)
Neutrophils Relative %: 66.3 % (ref 43.0–77.0)
Platelets: 245 10*3/uL (ref 150.0–400.0)
RBC: 4.91 Mil/uL (ref 3.87–5.11)
RDW: 14.1 % (ref 11.5–15.5)
WBC: 9.6 10*3/uL (ref 4.0–10.5)

## 2022-09-23 LAB — BASIC METABOLIC PANEL
BUN: 11 mg/dL (ref 6–23)
CO2: 25 mEq/L (ref 19–32)
Calcium: 8.5 mg/dL (ref 8.4–10.5)
Chloride: 103 mEq/L (ref 96–112)
Creatinine, Ser: 1.12 mg/dL (ref 0.40–1.20)
GFR: 56.37 mL/min — ABNORMAL LOW (ref >60.00)
Glucose, Bld: 101 mg/dL — ABNORMAL HIGH (ref 70–99)
Potassium: 3.7 mEq/L (ref 3.5–5.1)
Sodium: 133 mEq/L — ABNORMAL LOW (ref 135–145)

## 2022-09-23 LAB — LIPID PANEL
Cholesterol: 216 mg/dL — ABNORMAL HIGH (ref 0–200)
HDL: 46.8 mg/dL (ref >39.00)
LDL Cholesterol: 138 mg/dL — ABNORMAL HIGH (ref 0–99)
NonHDL: 169.42
Total CHOL/HDL Ratio: 5
Triglycerides: 156 mg/dL — ABNORMAL HIGH (ref 0.0–149.0)
VLDL: 31.2 mg/dL (ref 0.0–40.0)

## 2022-09-23 LAB — HEPATIC FUNCTION PANEL
ALT: 13 U/L (ref 0–35)
AST: 16 U/L (ref 0–37)
Albumin: 3.8 g/dL (ref 3.5–5.2)
Alkaline Phosphatase: 64 U/L (ref 39–117)
Bilirubin, Direct: 0.1 mg/dL (ref 0.0–0.3)
Total Bilirubin: 0.5 mg/dL (ref 0.2–1.2)
Total Protein: 7 g/dL (ref 6.0–8.3)

## 2022-09-23 LAB — TSH: TSH: 1.3 u[IU]/mL (ref 0.35–5.50)

## 2022-09-23 LAB — HEMOGLOBIN A1C: Hgb A1c MFr Bld: 5.8 % (ref 4.6–6.5)

## 2022-09-23 MED ORDER — TINIDAZOLE 500 MG PO TABS: 2.0000 g | ORAL_TABLET | Freq: Once | ORAL | 0 refills | Status: AC

## 2022-09-23 MED ORDER — NEBIVOLOL HCL 5 MG PO TABS: 5.0000 mg | ORAL_TABLET | Freq: Every day | ORAL | 0 refills | Status: DC

## 2022-09-23 NOTE — Patient Instructions (Signed)
Hypertension, Adult High blood pressure (hypertension) is when the force of blood pumping through the arteries is too strong. The arteries are the blood vessels that carry blood from the heart throughout the body. Hypertension forces the heart to work harder to pump blood and may cause arteries to become narrow or stiff. Untreated or uncontrolled hypertension can lead to a heart attack, heart failure, a stroke, kidney disease, and other problems. A blood pressure reading consists of a higher number over a lower number. Ideally, your blood pressure should be below 120/80. The first ("top") number is called the systolic pressure. It is a measure of the pressure in your arteries as your heart beats. The second ("bottom") number is called the diastolic pressure. It is a measure of the pressure in your arteries as the heart relaxes. What are the causes? The exact cause of this condition is not known. There are some conditions that result in high blood pressure. What increases the risk? Certain factors may make you more likely to develop high blood pressure. Some of these risk factors are under your control, including: Smoking. Not getting enough exercise or physical activity. Being overweight. Having too much fat, sugar, calories, or salt (sodium) in your diet. Drinking too much alcohol. Other risk factors include: Having a personal history of heart disease, diabetes, high cholesterol, or kidney disease. Stress. Having a family history of high blood pressure and high cholesterol. Having obstructive sleep apnea. Age. The risk increases with age. What are the signs or symptoms? High blood pressure may not cause symptoms. Very high blood pressure (hypertensive crisis) may cause: Headache. Fast or irregular heartbeats (palpitations). Shortness of breath. Nosebleed. Nausea and vomiting. Vision changes. Severe chest pain, dizziness, and seizures. How is this diagnosed? This condition is diagnosed by  measuring your blood pressure while you are seated, with your arm resting on a flat surface, your legs uncrossed, and your feet flat on the floor. The cuff of the blood pressure monitor will be placed directly against the skin of your upper arm at the level of your heart. Blood pressure should be measured at least twice using the same arm. Certain conditions can cause a difference in blood pressure between your right and left arms. If you have a high blood pressure reading during one visit or you have normal blood pressure with other risk factors, you may be asked to: Return on a different day to have your blood pressure checked again. Monitor your blood pressure at home for 1 week or longer. If you are diagnosed with hypertension, you may have other blood or imaging tests to help your health care provider understand your overall risk for other conditions. How is this treated? This condition is treated by making healthy lifestyle changes, such as eating healthy foods, exercising more, and reducing your alcohol intake. You may be referred for counseling on a healthy diet and physical activity. Your health care provider may prescribe medicine if lifestyle changes are not enough to get your blood pressure under control and if: Your systolic blood pressure is above 130. Your diastolic blood pressure is above 80. Your personal target blood pressure may vary depending on your medical conditions, your age, and other factors. Follow these instructions at home: Eating and drinking  Eat a diet that is high in fiber and potassium, and low in sodium, added sugar, and fat. An example of this eating plan is called the DASH diet. DASH stands for Dietary Approaches to Stop Hypertension. To eat this way: Eat   plenty of fresh fruits and vegetables. Try to fill one half of your plate at each meal with fruits and vegetables. Eat whole grains, such as whole-wheat pasta, brown rice, or whole-grain bread. Fill about one  fourth of your plate with whole grains. Eat or drink low-fat dairy products, such as skim milk or low-fat yogurt. Avoid fatty cuts of meat, processed or cured meats, and poultry with skin. Fill about one fourth of your plate with lean proteins, such as fish, chicken without skin, beans, eggs, or tofu. Avoid pre-made and processed foods. These tend to be higher in sodium, added sugar, and fat. Reduce your daily sodium intake. Many people with hypertension should eat less than 1,500 mg of sodium a day. Do not drink alcohol if: Your health care provider tells you not to drink. You are pregnant, may be pregnant, or are planning to become pregnant. If you drink alcohol: Limit how much you have to: 0-1 drink a day for women. 0-2 drinks a day for men. Know how much alcohol is in your drink. In the [place] one drink equals one 12 oz bottle of beer (355 mL), one 5 oz glass of wine (148 mL), or one 1 oz glass of hard liquor (44 mL). Lifestyle  Work with your health care provider to maintain a healthy body weight or to lose weight. Ask what an ideal weight is for you. Get at least 30 minutes of exercise that causes your heart to beat faster (aerobic exercise) most days of the week. Activities may include walking, swimming, or biking. Include exercise to strengthen your muscles (resistance exercise), such as Pilates or lifting weights, as part of your weekly exercise routine. Try to do these types of exercises for 30 minutes at least 3 days a week. Do not use any products that contain nicotine or tobacco. These products include cigarettes, chewing tobacco, and vaping devices, such as e-cigarettes. If you need help quitting, ask your health care provider. Monitor your blood pressure at home as told by your health care provider. Keep all follow-up visits. This is important. Medicines Take over-the-counter and prescription medicines only as told by your health care provider. Follow directions carefully. Blood  pressure medicines must be taken as prescribed. Do not skip doses of blood pressure medicine. Doing this puts you at risk for problems and can make the medicine less effective. Ask your health care provider about side effects or reactions to medicines that you should watch for. Contact a health care provider if you: Think you are having a reaction to a medicine you are taking. Have headaches that keep coming back (recurring). Feel dizzy. Have swelling in your ankles. Have trouble with your vision. Get help right away if you: Develop a severe headache or confusion. Have unusual weakness or numbness. Feel faint. Have severe pain in your chest or abdomen. Vomit repeatedly. Have trouble breathing. These symptoms may be an emergency. Get help right away. Call 911. Do not wait to see if the symptoms will go away. Do not drive yourself to the hospital. Summary Hypertension is when the force of blood pumping through your arteries is too strong. If this condition is not controlled, it may put you at risk for serious complications. Your personal target blood pressure may vary depending on your medical conditions, your age, and other factors. For most people, a normal blood pressure is less than 120/80. Hypertension is treated with lifestyle changes, medicines, or a combination of both. Lifestyle changes include losing weight, eating a healthy,   low-sodium diet, exercising more, and limiting alcohol. This information is not intended to replace advice given to you by your health care provider. Make sure you discuss any questions you have with your health care provider. Document Revised: 09/07/2021 Document Reviewed: 09/07/2021 Elsevier Patient Education  2023 [employer] Inc[employer]  

## 2022-09-23 NOTE — Progress Notes (Unsigned)
Subjective:  Patient ID: Toni Dougherty, female    DOB: [birth year]  Age: 51 y.o. MRN: [id number]  CC: Hypertension and Annual Exam   HPI Toni Dougherty presents for a CPX and to establish.  She does kick boxing and walks her dog about one mile per day. Her endurance is good and she denies DOE, CP, SOB, edema.  History Toni Dougherty has a past medical history of ADD (attention deficit disorder) and Narcolepsy.   She has a past surgical history that includes Appendectomy.   Her family history includes Alcoholism in her brother, brother, brother, father, sister, sister, sister, and sister; Hypertension in her father; Stroke in her father.She reports that she has been smoking cigarettes. She has a 12.50 pack-year smoking history. She has never used smokeless tobacco. She reports current alcohol use of about 10.0 standard drinks of alcohol per week. She reports that she does not currently use drugs.  Outpatient Medications Prior to Visit  Medication Sig Dispense Refill   benzonatate (TESSALON) 100 MG capsule Take 1 capsule (100 mg total) by mouth every 8 (eight) hours. 21 capsule 0   cyclobenzaprine (FLEXERIL) 10 MG tablet Take 1 tablet (10 mg total) by mouth 2 (two) times daily as needed for muscle spasms. 20 tablet 0   ondansetron (ZOFRAN-ODT) 4 MG disintegrating tablet Take 1 tablet (4 mg total) by mouth every 8 (eight) hours as needed for nausea or vomiting. 20 tablet 0   sertraline (ZOLOFT) 100 MG tablet TAKE 1.5 TABLETS BY MOUTH DAILY 135 tablet 2   amphetamine-dextroamphetamine (ADDERALL) 30 MG tablet Take 1 tablet by mouth 3 (three) times daily. 90 tablet 0   No facility-administered medications prior to visit.    ROS Review of Systems  Constitutional: Negative.  Negative for chills, diaphoresis, fatigue and fever.  HENT: Negative.    Eyes: Negative.   Respiratory:  Negative for cough, chest tightness and wheezing.   Cardiovascular:  Negative for chest pain, palpitations and leg swelling.   Gastrointestinal:  Negative for abdominal pain, constipation, diarrhea, nausea and vomiting.  Endocrine: Negative.   Genitourinary: Negative.  Negative for difficulty urinating.  Musculoskeletal: Negative.   Skin: Negative.   Neurological: Negative.  Negative for dizziness, weakness and light-headedness.  Hematological:  Negative for adenopathy. Does not bruise/bleed easily.  Psychiatric/Behavioral: Negative.      Objective:  BP (!) 144/96 (BP Location: Left Arm, Patient Position: Sitting, Cuff Size: Large)   Pulse 68   Temp 98.8 F (37.1 C) (Oral)   Resp 16   Ht 5\' 11"  (1.803 m)   Wt 242 lb (109.8 kg)   LMP 08/28/2022 (Exact Date)   SpO2 98%   BMI 33.75 kg/m   Physical Exam Vitals reviewed.  Constitutional:      Appearance: She is obese.  HENT:     Mouth/Throat:     Mouth: Mucous membranes are moist.  Eyes:     General: No scleral icterus.    Conjunctiva/sclera: Conjunctivae normal.  Cardiovascular:     Rate and Rhythm: Normal rate and regular rhythm.     Heart sounds: No murmur heard.    Comments: EKG- NSR, 70 bpm LVH with reciprocal changes- TWI is more prominent than before No Q waves Pulmonary:     Effort: Pulmonary effort is normal.     Breath sounds: No stridor. No wheezing, rhonchi or rales.  Abdominal:     General: Abdomen is protuberant. Bowel sounds are normal.     Palpations: There is no hepatomegaly, splenomegaly or  mass.     Tenderness: There is no abdominal tenderness.  Musculoskeletal:        General: Normal range of motion.     Cervical back: Neck supple.     Right lower leg: No edema.     Left lower leg: No edema.  Lymphadenopathy:     Cervical: No cervical adenopathy.  Skin:    General: Skin is warm and dry.  Neurological:     General: No focal deficit present.     Mental Status: She is alert.  Psychiatric:        Mood and Affect: Mood normal.        Behavior: Behavior normal.    Lab Results  Component Value Date   WBC 9.6  09/23/2022   HGB 14.9 09/23/2022   HCT 45.4 09/23/2022   PLT 245.0 09/23/2022   GLUCOSE 101 (H) 09/23/2022   CHOL 216 (H) 09/23/2022   TRIG 156.0 (H) 09/23/2022   HDL 46.80 09/23/2022   LDLCALC 138 (H) 09/23/2022   ALT 13 09/23/2022   AST 16 09/23/2022   NA 133 (L) 09/23/2022   K 3.7 09/23/2022   CL 103 09/23/2022   CREATININE 1.12 09/23/2022   BUN 11 09/23/2022   CO2 25 09/23/2022   TSH 1.30 09/23/2022   HGBA1C 5.8 09/23/2022      Assessment & Plan:   Toni Dougherty was seen today for hypertension and annual exam.  Diagnoses and all orders for this visit:  Encounter for general adult medical examination with abnormal findings- Exam completed, labs reviewed-statin is not indicated, vaccines reviewed-she refused the shingles vaccine and influenza vaccine, cancer screenings addressed, patient education was given. -     Lipid panel; Future -     Hepatitis C antibody; Future -     HIV Antibody (routine testing w rflx); Future -     HIV Antibody (routine testing w rflx) -     Hepatitis C antibody -     Lipid panel  Primary hypertension- Labs are negative for secondary causes or endorgan damage.  We will treat with nebivolol. -     TSH; Future -     Urinalysis, Routine w reflex microscopic; Future -     Hepatic function panel; Future -     CBC with Differential/Platelet; Future -     Basic metabolic panel; Future -     EKG 12-Lead -     Basic metabolic panel -     CBC with Differential/Platelet -     Hepatic function panel -     Urinalysis, Routine w reflex microscopic -     TSH -     nebivolol (BYSTOLIC) 5 MG tablet; Take 1 tablet (5 mg total) by mouth daily.  Need for hepatitis C screening test -     Hepatitis C antibody; Future -     Hepatitis C antibody  Screen for colon cancer -     Cologuard  Tobacco abuse -     Ambulatory Referral for Lung Cancer Scre  Screening mammogram for breast cancer -     MM DIGITAL SCREENING BILATERAL; Future  Class 1 obesity due to  excess calories with serious comorbidity and body mass index (BMI) of 33.0 to 33.9 in adult -     TSH; Future -     Hemoglobin A1c; Future -     CBC with Differential/Platelet; Future -     Basic metabolic panel; Future -     Basic metabolic panel -  CBC with Differential/Platelet -     Hemoglobin A1c -     TSH  LVH (left ventricular 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  Trichomonal cystitis and urethritis -     tinidazole (TINDAMAX) 500 MG tablet; Take 4 tablets (2,000 mg total) by mouth once for 1 dose.   I have discontinued Toni Dougherty's benzonatate, sertraline, ondansetron, and cyclobenzaprine. I am also having her start on tinidazole and nebivolol. Additionally, I am having her maintain her amphetamine-dextroamphetamine.  Meds ordered this encounter  Medications   tinidazole (TINDAMAX) 500 MG tablet    Sig: Take 4 tablets (2,000 mg total) by mouth once for 1 dose.    Dispense:  4 tablet    Refill:  0   nebivolol (BYSTOLIC) 5 MG tablet    Sig: Take 1 tablet (5 mg total) by mouth daily.    Dispense:  90 tablet    Refill:  0     Follow-up: Return in about 3 months (around 12/24/2022).  Sanda Linger, MD

## 2022-09-28 ENCOUNTER — Telehealth: Payer: Self-pay | Admitting: Internal Medicine

## 2022-09-28 NOTE — Telephone Encounter (Signed)
Caller Name: Kayda Call back phone #: [phone]   MEDICATION(S):  amphetamine-dextroamphetamine (ADDERALL) 30 MG tablet   Days of Med Remaining: none  Has the patient contacted their pharmacy (YES/NO)? No  She said she had appointment on 09/23/22 and was told it would be sent in  Preferred [facility]:  [facility] [address]  ~~~Please advise patient/caregiver to allow 2-3 business days to process RX refills.

## 2022-09-29 ENCOUNTER — Other Ambulatory Visit: Payer: Self-pay | Admitting: Internal Medicine

## 2022-09-29 DIAGNOSIS — F900 Attention-deficit hyperactivity disorder, predominantly inattentive type: Secondary | ICD-10-CM | POA: Insufficient documentation

## 2022-09-29 MED ORDER — AMPHETAMINE-DEXTROAMPHET ER 30 MG PO CP24: 30.0000 mg | ORAL_CAPSULE | ORAL | 0 refills | Status: DC

## 2022-09-29 NOTE — Telephone Encounter (Signed)
Pt has bee informed of the 11/10 result note below:  "Toni Dougherty - -   There are red blood cells, white blood cells, and trichomonas in your urine.  I have prescribed an antibiotic to treat this.  Your sexual partner should also be treated for trichomonas.  Your cholesterol level is mildly elevated but does not need to be treated with a medication.  Your blood pressure is too high for Adderall.  I have prescribed a blood pressure medication.  Your sodium level is slightly low.  Let me know if you experience any dizziness or lightheadedness.    Sanda Linger, MD"  Pt expressed understanding. However, she stated that her job is potentially in jeopardy due to high stress and the inability to focus or keep track of things because of her severe ADHD.   Please advise.

## 2022-11-03 ENCOUNTER — Other Ambulatory Visit: Payer: Self-pay | Admitting: Internal Medicine

## 2022-11-03 DIAGNOSIS — F900 Attention-deficit hyperactivity disorder, predominantly inattentive type: Secondary | ICD-10-CM

## 2022-11-03 MED ORDER — AMPHETAMINE-DEXTROAMPHET ER 30 MG PO CP24: 30.0000 mg | ORAL_CAPSULE | ORAL | 0 refills | Status: DC

## 2022-11-30 ENCOUNTER — Other Ambulatory Visit: Payer: Self-pay | Admitting: Internal Medicine

## 2022-11-30 DIAGNOSIS — F900 Attention-deficit hyperactivity disorder, predominantly inattentive type: Secondary | ICD-10-CM

## 2022-12-01 MED ORDER — AMPHETAMINE-DEXTROAMPHET ER 30 MG PO CP24: 30.0000 mg | ORAL_CAPSULE | ORAL | 0 refills | Status: DC

## 2022-12-27 ENCOUNTER — Telehealth: Payer: Self-pay | Admitting: Internal Medicine

## 2022-12-27 ENCOUNTER — Ambulatory Visit: Payer: BC Managed Care – PPO | Admitting: Internal Medicine

## 2022-12-27 ENCOUNTER — Encounter: Payer: Self-pay | Admitting: Internal Medicine

## 2022-12-27 VITALS — BP 128/80 | HR 71 | Temp 98.2°F | Resp 16 | Ht 71.0 in | Wt 235.0 lb

## 2022-12-27 DIAGNOSIS — I119 Hypertensive heart disease without heart failure: Secondary | ICD-10-CM

## 2022-12-27 DIAGNOSIS — Z1211 Encounter for screening for malignant neoplasm of colon: Secondary | ICD-10-CM

## 2022-12-27 DIAGNOSIS — F900 Attention-deficit hyperactivity disorder, predominantly inattentive type: Secondary | ICD-10-CM | POA: Diagnosis not present

## 2022-12-27 DIAGNOSIS — I10 Essential (primary) hypertension: Secondary | ICD-10-CM

## 2022-12-27 MED ORDER — AMPHETAMINE-DEXTROAMPHETAMINE 20 MG PO TABS: 20.0000 mg | ORAL_TABLET | Freq: Two times a day (BID) | ORAL | 0 refills | Status: DC

## 2022-12-27 MED ORDER — AMPHETAMINE-DEXTROAMPHETAMINE 30 MG PO TABS: 30.0000 mg | ORAL_TABLET | Freq: Two times a day (BID) | ORAL | 0 refills | Status: DC

## 2022-12-27 MED ORDER — NEBIVOLOL HCL 5 MG PO TABS: 5.0000 mg | ORAL_TABLET | Freq: Every day | ORAL | 0 refills | Status: DC

## 2022-12-27 NOTE — Telephone Encounter (Signed)
Pharmacist, Estill Bamberg, has be informed that pt should only be taking Adderrall 63m.  They have deleted the Adderall 281mout of the system.

## 2022-12-27 NOTE — Progress Notes (Unsigned)
Subjective:  Patient ID: Toni Dougherty, female    DOB: [birth year]  Age: 51 y.o. MRN: [id number]  CC: Hypertension   HPI Toni Dougherty presents for f/up ----  She complains in the extended release form of Adderall is not helping with her symptoms.  She continues to complain of feeling fatigued, unfocused, and unproductive.  Outpatient Medications Prior to Visit  Medication Sig Dispense Refill   amphetamine-dextroamphetamine (ADDERALL XR) 30 MG 24 hr capsule Take 1 capsule (30 mg total) by mouth every morning. 30 capsule 0   nebivolol (BYSTOLIC) 5 MG tablet Take 1 tablet (5 mg total) by mouth daily. 90 tablet 0   No facility-administered medications prior to visit.    ROS Review of Systems  Constitutional:  Positive for fatigue. Negative for chills, diaphoresis and unexpected weight change.  HENT: Negative.    Eyes: Negative.   Respiratory:  Negative for cough, chest tightness, shortness of breath and wheezing.   Cardiovascular:  Negative for chest pain, palpitations and leg swelling.  Gastrointestinal:  Negative for abdominal pain, diarrhea, nausea and vomiting.  Endocrine: Negative.   Genitourinary: Negative.  Negative for difficulty urinating.  Musculoskeletal: Negative.   Skin: Negative.   Neurological:  Negative for dizziness, weakness and headaches.  Hematological:  Negative for adenopathy. Does not bruise/bleed easily.  Psychiatric/Behavioral: Negative.      Objective:  BP 128/80 (BP Location: Left Arm, Patient Position: Sitting, Cuff Size: Large)   Pulse 71   Temp 98.2 F (36.8 C) (Oral)   Resp 16   Ht 5' 11"$  (1.803 m)   Wt 235 lb (106.6 kg)   LMP 12/05/2022 (Exact Date)   SpO2 98%   BMI 32.78 kg/m   BP Readings from Last 3 Encounters:  12/27/22 128/80  09/23/22 (!) 144/96  11/07/21 (!) 144/98    Wt Readings from Last 3 Encounters:  12/27/22 235 lb (106.6 kg)  09/23/22 242 lb (109.8 kg)  11/06/21 200 lb (90.7 kg)    Physical Exam Vitals reviewed.   Constitutional:      Appearance: She is not ill-appearing.  HENT:     Mouth/Throat:     Mouth: Mucous membranes are moist.  Eyes:     General: No scleral icterus.    Conjunctiva/sclera: Conjunctivae normal.  Cardiovascular:     Rate and Rhythm: Normal rate and regular rhythm.     Heart sounds: No murmur heard. Pulmonary:     Effort: Pulmonary effort is normal.     Breath sounds: No stridor. No wheezing, rhonchi or rales.  Abdominal:     General: Abdomen is flat.     Palpations: There is no mass.     Tenderness: There is no abdominal tenderness. There is no guarding.     Hernia: No hernia is present.  Musculoskeletal:        General: Normal range of motion.     Cervical back: Neck supple.     Right lower leg: No edema.     Left lower leg: No edema.  Lymphadenopathy:     Cervical: No cervical adenopathy.  Skin:    General: Skin is warm and dry.  Neurological:     General: No focal deficit present.     Mental Status: She is alert. Mental status is at baseline.  Psychiatric:        Mood and Affect: Mood normal.        Behavior: Behavior normal.        Thought Content: Thought content normal.  Judgment: Judgment normal.     Lab Results  Component Value Date   WBC 9.6 09/23/2022   HGB 14.9 09/23/2022   HCT 45.4 09/23/2022   PLT 245.0 09/23/2022   GLUCOSE 101 (H) 09/23/2022   CHOL 216 (H) 09/23/2022   TRIG 156.0 (H) 09/23/2022   HDL 46.80 09/23/2022   LDLCALC 138 (H) 09/23/2022   ALT 13 09/23/2022   AST 16 09/23/2022   NA 133 (L) 09/23/2022   K 3.7 09/23/2022   CL 103 09/23/2022   CREATININE 1.12 09/23/2022   BUN 11 09/23/2022   CO2 25 09/23/2022   TSH 1.30 09/23/2022   HGBA1C 5.8 09/23/2022    DG Chest Portable 1 View  Result Date: 11/06/2021 CLINICAL DATA:  Febrile common nausea, vomiting and body aches. Cough EXAM: PORTABLE CHEST 1 VIEW COMPARISON:  April 30, 2020 FINDINGS: The heart size and mediastinal contours are within normal limits. No focal  consolidation. No visible pleural effusion or pneumothorax. The visualized skeletal structures are unremarkable. IMPRESSION: No active disease. Electronically Signed   By: Dahlia Bailiff M.D.   On: 11/06/2021 22:09    Assessment & Plan:   Toni Dougherty was seen today for hypertension.  Diagnoses and all orders for this visit:  Attention deficit hyperactivity disorder (ADHD), predominantly inattentive type- Will change to immediate release Adderall. -     amphetamine-dextroamphetamine (ADDERALL) 20 MG tablet; Take 1 tablet (20 mg total) by mouth 2 (two) times daily.  Primary hypertension- Her blood pressure is adequately well-controlled. -     nebivolol (BYSTOLIC) 5 MG tablet; Take 1 tablet (5 mg total) by mouth daily.  LVH (left ventricular hypertrophy) due to hypertensive disease, without heart failure- She has a normal volume status. -     nebivolol (BYSTOLIC) 5 MG tablet; Take 1 tablet (5 mg total) by mouth daily.  Screen for colon cancer -     Cologuard   I have discontinued Toni Dougherty's amphetamine-dextroamphetamine. I am also having her start on amphetamine-dextroamphetamine. Additionally, I am having her maintain her nebivolol.  Meds ordered this encounter  Medications   amphetamine-dextroamphetamine (ADDERALL) 20 MG tablet    Sig: Take 1 tablet (20 mg total) by mouth 2 (two) times daily.    Dispense:  60 tablet    Refill:  0   nebivolol (BYSTOLIC) 5 MG tablet    Sig: Take 1 tablet (5 mg total) by mouth daily.    Dispense:  90 tablet    Refill:  0     Follow-up: Return in about 4 months (around 04/27/2023).  Toni Calico, MD

## 2022-12-27 NOTE — Patient Instructions (Signed)
Hypertension, Adult High blood pressure (hypertension) is when the force of blood pumping through the arteries is too strong. The arteries are the blood vessels that carry blood from the heart throughout the body. Hypertension forces the heart to work harder to pump blood and may cause arteries to become narrow or stiff. Untreated or uncontrolled hypertension can lead to a heart attack, heart failure, a stroke, kidney disease, and other problems. A blood pressure reading consists of a higher number over a lower number. Ideally, your blood pressure should be below 120/80. The first ("top") number is called the systolic pressure. It is a measure of the pressure in your arteries as your heart beats. The second ("bottom") number is called the diastolic pressure. It is a measure of the pressure in your arteries as the heart relaxes. What are the causes? The exact cause of this condition is not known. There are some conditions that result in high blood pressure. What increases the risk? Certain factors may make you more likely to develop high blood pressure. Some of these risk factors are under your control, including: Smoking. Not getting enough exercise or physical activity. Being overweight. Having too much fat, sugar, calories, or salt (sodium) in your diet. Drinking too much alcohol. Other risk factors include: Having a personal history of heart disease, diabetes, high cholesterol, or kidney disease. Stress. Having a family history of high blood pressure and high cholesterol. Having obstructive sleep apnea. Age. The risk increases with age. What are the signs or symptoms? High blood pressure may not cause symptoms. Very high blood pressure (hypertensive crisis) may cause: Headache. Fast or irregular heartbeats (palpitations). Shortness of breath. Nosebleed. Nausea and vomiting. Vision changes. Severe chest pain, dizziness, and seizures. How is this diagnosed? This condition is diagnosed by  measuring your blood pressure while you are seated, with your arm resting on a flat surface, your legs uncrossed, and your feet flat on the floor. The cuff of the blood pressure monitor will be placed directly against the skin of your upper arm at the level of your heart. Blood pressure should be measured at least twice using the same arm. Certain conditions can cause a difference in blood pressure between your right and left arms. If you have a high blood pressure reading during one visit or you have normal blood pressure with other risk factors, you may be asked to: Return on a different day to have your blood pressure checked again. Monitor your blood pressure at home for 1 week or longer. If you are diagnosed with hypertension, you may have other blood or imaging tests to help your health care provider understand your overall risk for other conditions. How is this treated? This condition is treated by making healthy lifestyle changes, such as eating healthy foods, exercising more, and reducing your alcohol intake. You may be referred for counseling on a healthy diet and physical activity. Your health care provider may prescribe medicine if lifestyle changes are not enough to get your blood pressure under control and if: Your systolic blood pressure is above 130. Your diastolic blood pressure is above 80. Your personal target blood pressure may vary depending on your medical conditions, your age, and other factors. Follow these instructions at home: Eating and drinking  Eat a diet that is high in fiber and potassium, and low in sodium, added sugar, and fat. An example of this eating plan is called the DASH diet. DASH stands for Dietary Approaches to Stop Hypertension. To eat this way: Eat   plenty of fresh fruits and vegetables. Try to fill one half of your plate at each meal with fruits and vegetables. Eat whole grains, such as whole-wheat pasta, brown rice, or whole-grain bread. Fill about one  fourth of your plate with whole grains. Eat or drink low-fat dairy products, such as skim milk or low-fat yogurt. Avoid fatty cuts of meat, processed or cured meats, and poultry with skin. Fill about one fourth of your plate with lean proteins, such as fish, chicken without skin, beans, eggs, or tofu. Avoid pre-made and processed foods. These tend to be higher in sodium, added sugar, and fat. Reduce your daily sodium intake. Many people with hypertension should eat less than 1,500 mg of sodium a day. Do not drink alcohol if: Your health care provider tells you not to drink. You are pregnant, may be pregnant, or are planning to become pregnant. If you drink alcohol: Limit how much you have to: 0-1 drink a day for women. 0-2 drinks a day for men. Know how much alcohol is in your drink. In the [place] one drink equals one 12 oz bottle of beer (355 mL), one 5 oz glass of wine (148 mL), or one 1 oz glass of hard liquor (44 mL). Lifestyle  Work with your health care provider to maintain a healthy body weight or to lose weight. Ask what an ideal weight is for you. Get at least 30 minutes of exercise that causes your heart to beat faster (aerobic exercise) most days of the week. Activities may include walking, swimming, or biking. Include exercise to strengthen your muscles (resistance exercise), such as Pilates or lifting weights, as part of your weekly exercise routine. Try to do these types of exercises for 30 minutes at least 3 days a week. Do not use any products that contain nicotine or tobacco. These products include cigarettes, chewing tobacco, and vaping devices, such as e-cigarettes. If you need help quitting, ask your health care provider. Monitor your blood pressure at home as told by your health care provider. Keep all follow-up visits. This is important. Medicines Take over-the-counter and prescription medicines only as told by your health care provider. Follow directions carefully. Blood  pressure medicines must be taken as prescribed. Do not skip doses of blood pressure medicine. Doing this puts you at risk for problems and can make the medicine less effective. Ask your health care provider about side effects or reactions to medicines that you should watch for. Contact a health care provider if you: Think you are having a reaction to a medicine you are taking. Have headaches that keep coming back (recurring). Feel dizzy. Have swelling in your ankles. Have trouble with your vision. Get help right away if you: Develop a severe headache or confusion. Have unusual weakness or numbness. Feel faint. Have severe pain in your chest or abdomen. Vomit repeatedly. Have trouble breathing. These symptoms may be an emergency. Get help right away. Call 911. Do not wait to see if the symptoms will go away. Do not drive yourself to the hospital. Summary Hypertension is when the force of blood pumping through your arteries is too strong. If this condition is not controlled, it may put you at risk for serious complications. Your personal target blood pressure may vary depending on your medical conditions, your age, and other factors. For most people, a normal blood pressure is less than 120/80. Hypertension is treated with lifestyle changes, medicines, or a combination of both. Lifestyle changes include losing weight, eating a healthy,   low-sodium diet, exercising more, and limiting alcohol. This information is not intended to replace advice given to you by your health care provider. Make sure you discuss any questions you have with your health care provider. Document Revised: 09/07/2021 Document Reviewed: 09/07/2021 Elsevier Patient Education  2023 [employer] Inc[employer]  

## 2022-12-27 NOTE — Telephone Encounter (Signed)
The pharmacist from [facility] called and said two prescriptions were sent in for the patient's Adderall. She wanted to know which one Dr. Ronnald Ramp wanted filled since combined they go over the daily limit. Best callback number for the pharmacy is [phone].

## 2023-01-23 ENCOUNTER — Other Ambulatory Visit: Payer: Self-pay | Admitting: Internal Medicine

## 2023-01-23 DIAGNOSIS — F900 Attention-deficit hyperactivity disorder, predominantly inattentive type: Secondary | ICD-10-CM

## 2023-01-23 MED ORDER — AMPHETAMINE-DEXTROAMPHETAMINE 30 MG PO TABS: 30.0000 mg | ORAL_TABLET | Freq: Two times a day (BID) | ORAL | 0 refills | Status: DC

## 2023-02-21 ENCOUNTER — Other Ambulatory Visit: Payer: Self-pay | Admitting: Internal Medicine

## 2023-02-21 DIAGNOSIS — F900 Attention-deficit hyperactivity disorder, predominantly inattentive type: Secondary | ICD-10-CM

## 2023-02-21 MED ORDER — AMPHETAMINE-DEXTROAMPHETAMINE 30 MG PO TABS: 30.0000 mg | ORAL_TABLET | Freq: Two times a day (BID) | ORAL | 0 refills | Status: DC

## 2023-03-21 ENCOUNTER — Other Ambulatory Visit: Payer: Self-pay

## 2023-03-21 ENCOUNTER — Emergency Department (HOSPITAL_BASED_OUTPATIENT_CLINIC_OR_DEPARTMENT_OTHER)
Admission: EM | Admit: 2023-03-21 | Discharge: 2023-03-21 | Disposition: A | Discharge status: Home / Self Care | Payer: BC Managed Care – PPO | Attending: Emergency Medicine | Admitting: Emergency Medicine

## 2023-03-21 DIAGNOSIS — R112 Nausea with vomiting, unspecified: Secondary | ICD-10-CM | POA: Diagnosis not present

## 2023-03-21 DIAGNOSIS — I10 Essential (primary) hypertension: Secondary | ICD-10-CM | POA: Insufficient documentation

## 2023-03-21 DIAGNOSIS — E876 Hypokalemia: Secondary | ICD-10-CM | POA: Diagnosis not present

## 2023-03-21 DIAGNOSIS — E860 Dehydration: Secondary | ICD-10-CM | POA: Diagnosis not present

## 2023-03-21 LAB — COMPREHENSIVE METABOLIC PANEL
ALT: 19 U/L (ref 0–44)
AST: 24 U/L (ref 15–41)
Albumin: 3.5 g/dL (ref 3.5–5.0)
Alkaline Phosphatase: 64 U/L (ref 38–126)
Anion gap: 11 (ref 5–15)
BUN: 17 mg/dL (ref 6–20)
CO2: 24 mmol/L (ref 22–32)
Calcium: 8.7 mg/dL — ABNORMAL LOW (ref 8.9–10.3)
Chloride: 100 mmol/L (ref 98–111)
Creatinine, Ser: 1.68 mg/dL — ABNORMAL HIGH (ref 0.44–1.00)
GFR, Estimated: 36 mL/min — ABNORMAL LOW (ref >60)
Glucose, Bld: 95 mg/dL (ref 70–99)
Potassium: 3.1 mmol/L — ABNORMAL LOW (ref 3.5–5.1)
Sodium: 135 mmol/L (ref 135–145)
Total Bilirubin: 0.6 mg/dL (ref 0.3–1.2)
Total Protein: 7.6 g/dL (ref 6.5–8.1)

## 2023-03-21 LAB — CBC WITH DIFFERENTIAL/PLATELET
Abs Immature Granulocytes: 0.1 10*3/uL — ABNORMAL HIGH (ref 0.00–0.07)
Basophils Absolute: 0.1 10*3/uL (ref 0.0–0.1)
Basophils Relative: 1 %
Eosinophils Absolute: 0 10*3/uL (ref 0.0–0.5)
Eosinophils Relative: 0 %
HCT: 42.1 % (ref 36.0–46.0)
Hemoglobin: 14.1 g/dL (ref 12.0–15.0)
Immature Granulocytes: 1 %
Lymphocytes Relative: 21 %
Lymphs Abs: 2.4 10*3/uL (ref 0.7–4.0)
MCH: 30 pg (ref 26.0–34.0)
MCHC: 33.5 g/dL (ref 30.0–36.0)
MCV: 89.6 fL (ref 80.0–100.0)
Monocytes Absolute: 1.2 10*3/uL — ABNORMAL HIGH (ref 0.1–1.0)
Monocytes Relative: 11 %
Neutro Abs: 7.3 10*3/uL (ref 1.7–7.7)
Neutrophils Relative %: 66 %
Platelets: 280 10*3/uL (ref 150–400)
RBC: 4.7 MIL/uL (ref 3.87–5.11)
RDW: 13.2 % (ref 11.5–15.5)
WBC: 11.1 10*3/uL — ABNORMAL HIGH (ref 4.0–10.5)
nRBC: 0 % (ref 0.0–0.2)

## 2023-03-21 LAB — CBG MONITORING, ED: Glucose-Capillary: 92 mg/dL (ref 70–99)

## 2023-03-21 LAB — MAGNESIUM: Magnesium: 2.4 mg/dL (ref 1.7–2.4)

## 2023-03-21 LAB — LIPASE, BLOOD: Lipase: <10 U/L — ABNORMAL LOW (ref 11–51)

## 2023-03-21 LAB — CK: Total CK: 121 U/L (ref 38–234)

## 2023-03-21 MED ORDER — ONDANSETRON 4 MG PO TBDP: 4.0000 mg | ORAL_TABLET | Freq: Three times a day (TID) | ORAL | 0 refills | Status: DC | PRN

## 2023-03-21 MED ORDER — POTASSIUM CHLORIDE 10 MEQ/100ML IV SOLN
10.0000 meq | INTRAVENOUS | Status: AC
  Administered 2023-03-21 (×2): 10 meq via INTRAVENOUS
  Filled 2023-03-21: qty 100

## 2023-03-21 MED ORDER — SODIUM CHLORIDE 0.9 % IV BOLUS
1000.0000 mL | Freq: Once | INTRAVENOUS | Status: AC
  Administered 2023-03-21: 1000 mL via INTRAVENOUS

## 2023-03-21 MED ORDER — POTASSIUM CHLORIDE CRYS ER 20 MEQ PO TBCR
40.0000 meq | EXTENDED_RELEASE_TABLET | Freq: Once | ORAL | Status: AC
  Administered 2023-03-21: 40 meq via ORAL
  Filled 2023-03-21: qty 2

## 2023-03-21 NOTE — ED Notes (Signed)
Discharge instructions and follow up care reviewed and explained, pt verbalized understanding and had no further questions on d/c.  

## 2023-03-21 NOTE — ED Provider Notes (Signed)
[place] EMERGENCY DEPARTMENT AT [facility] Provider Note   CSN: [id number] Arrival date & time: 03/21/23  1024     History  Chief Complaint  Patient presents with   Weakness    Toni Dougherty is a 52 y.o. female who presents emergency department with chief complaint of near syncope and weakness.  Patient just returned today from a 5-day vacation in [place].  During that time she had severe nausea vomiting and diarrhea.  Patient states that she was really unable to tolerate food or fluids.  She thought she might have altitude sickness.  She did not seek any medical care at that time and did not take any medications.  She states that as soon as she boarded the plane to return home yesterday her nausea subsided however she feels extremely weak and lightheaded whenever she stands.  She is also been complaining of muscle pain and cramping in her extremities.   Weakness      Home Medications Prior to Admission medications   Medication Sig Start Date End Date Taking? Authorizing Provider  amphetamine-dextroamphetamine (ADDERALL) 30 MG tablet Take 1 tablet by mouth 2 (two) times daily. 02/21/23   Etta Grandchild, MD  nebivolol (BYSTOLIC) 5 MG tablet Take 1 tablet (5 mg total) by mouth daily. 12/27/22   Etta Grandchild, MD      Allergies    Erythromycin and Penicillins    Review of Systems   Review of Systems  Neurological:  Positive for weakness.    Physical Exam Updated Vital Signs There were no vitals taken for this visit. Physical Exam Vitals and nursing note reviewed.  Constitutional:      General: She is not in acute distress.    Appearance: She is well-developed. She is not diaphoretic.  HENT:     Head: Normocephalic and atraumatic.     Right Ear: External ear normal.     Left Ear: External ear normal.     Nose: Nose normal.     Mouth/Throat:     Mouth: Mucous membranes are dry.  Eyes:     General: No scleral icterus.    Conjunctiva/sclera: Conjunctivae  normal.  Cardiovascular:     Rate and Rhythm: Normal rate and regular rhythm.     Heart sounds: Normal heart sounds. No murmur heard.    No friction rub. No gallop.  Pulmonary:     Effort: Pulmonary effort is normal. No respiratory distress.     Breath sounds: Normal breath sounds.  Abdominal:     General: Bowel sounds are normal. There is no distension.     Palpations: Abdomen is soft. There is no mass.     Tenderness: There is no abdominal tenderness. There is no guarding.  Musculoskeletal:     Cervical back: Normal range of motion.  Skin:    General: Skin is warm and dry.  Neurological:     Mental Status: She is alert and oriented to person, place, and time.  Psychiatric:        Behavior: Behavior normal.     ED Results / Procedures / Treatments   Labs (all labs ordered are listed, but only abnormal results are displayed) Labs Reviewed - No data to display  EKG None  Radiology No results found.  Procedures Procedures    Medications Ordered in ED Medications - No data to display  ED Course/ Medical Decision Making/ A&P Clinical Course as of 03/21/23 1719  Tue Mar 21, 2023  1250 Potassium(!): 3.1 [  AH]  1252 Comprehensive metabolic panel(!) [AH]  1252 Lipase, blood(!) [AH]  1252 CBC WITH DIFFERENTIAL(!) [AH]    Clinical Course User Index [AH] Arthor Captain, PA-C                             Medical Decision Making This patient presents to the ED for concern of weakness after n/v/and diarrhea, this involves an extensive number of treatment options, and is a complaint that carries with it a high risk of complications and morbidity.  The differential diagnosis of diarrhea includes but is not limited to Viral- norovirus/rotavirus; Bacterial-Campylobacter,Shigella, Salmonella, Escherichia coli, E. coli 0157:H7, Yersinia enterocolitica, Vibrio cholerae, Clostridium difficile. Parasitic- Giardia lamblia, Cryptosporidium,Entamoeba histolytica,Cyclospora,  Microsporidium. Toxin- Staphylococcus aureus, Bacillus cereus. Noninfectious causes include GI Bleed, Appendicitis, Mesenteric Ischemia, Diverticulitis, Adrenal Crisis, Thyroid Storm, Toxicologic exposures, Antibiotic or drug-associated, inflammatory bowel disease.    Co morbidities:      hypertension  Social Determinants of Health:         Additional history:     Lab Tests:   I Ordered, and personally interpreted labs.  The pertinent results include:  Hypokalemia to 3.1 repleted here in the emergency department orally and IV Mild AKI   Imaging Studies: N/a  Cardiac Monitoring/ECG:       The patient was maintained on a cardiac monitor.  I personally viewed and interpreted the cardiac monitored which showed an underlying rhythm of: Sinus rhythm  Medicines ordered and prescription drug management:  I ordered medication including Medications sodium chloride 0.9 % bolus 1,000 mL (0 mLs Intravenous Stopped 03/21/23 1300)  potassium chloride 10 mEq in 100 mL IVPB (0 mEq Intravenous Stopped 03/21/23 1525) potassium chloride SA (KLOR-CON M) CR tablet 40 mEq (40 mEq Oral Given 03/21/23 1333) sodium chloride 0.9 % bolus 1,000 mL (0 mLs Intravenous Stopped 03/21/23 1600) for dehydration and hypokalemia Reevaluation of the patient after these medicines showed that the patient improved I have reviewed the patients home medicines and have made adjustments as needed  Test Considered:       Repeat metabolic panel however patient given sufficient amount of potassium fluids  Critical Interventions:       Fluids and potassium  Consultations Obtained: N/A  Problem List / ED Course:       (E86.0) Dehydration  (primary encounter diagnosis)  (R11.2) Nausea and vomiting, unspecified vomiting type  (E87.6) Hypokalemia   MDM: Patient here with several days of nausea vomiting and diarrhea now feeling weak with standing.  Patient given fluid resuscitation with significant improvement  in her symptoms.  She is also notably hypokalemic likely the cause of her cramping in her extremities, no evidence of rhabdomyolysis.  Patient feels greatly improved after fluid resuscitation and potassium.  Will have her follow closely with her physician for repeat metabolic testing.  She was notably mildly hypertensive and advised to follow closely with PCP for the same.  She appears otherwise appropriate for discharge   Dispostion:  After consideration of the diagnostic results and the patients response to treatment, I feel that the patent would benefit from discharge.    Amount and/or Complexity of Data Reviewed Labs: ordered. Decision-making details documented in ED Course.  Risk Prescription drug management.           Final Clinical Impression(s) / ED Diagnoses Final diagnoses:  Dehydration  Nausea and vomiting, unspecified vomiting type  Hypokalemia    Rx / DC Orders ED Discharge Orders  None         Arthor Captain, PA-C 03/21/23 1740    Melene Plan, DO 03/22/23 [phone]

## 2023-03-21 NOTE — Discharge Instructions (Addendum)
He just elevation in your creatinine and kidney function likely due to the loss of fluid and potassium due to your nausea vomiting and diarrhea.  It would be best to follow-up with your primary care doctor in a week or 2 to have your labs reevaluated.  I am discharging you with some nausea medication.  Please take this if you have any recurrence of your nausea or vomiting.  Return immediately for the following symptoms. Continue frequent small sips (10-20 ml) of clear liquids every 5-10 minutes. Gatorade or powerade are good options. Avoid milk, orange juice, and grape juice for now. . Once you have not had further vomiting with the small sips for 4 hours, you may begin to drink larger volumes of fluids at a time and try a bland diet which may include saltine crackers, applesauce, breads, pastas, bananas, bland chicken. If you continues to vomit despite medication, return to the ED for repeat evaluation. Otherwise, follow up with your doctor in 2-3 days for a re-check.

## 2023-03-23 ENCOUNTER — Other Ambulatory Visit: Payer: Self-pay | Admitting: Internal Medicine

## 2023-03-23 DIAGNOSIS — F900 Attention-deficit hyperactivity disorder, predominantly inattentive type: Secondary | ICD-10-CM

## 2023-03-23 MED ORDER — AMPHETAMINE-DEXTROAMPHETAMINE 30 MG PO TABS
30.0000 mg | ORAL_TABLET | Freq: Two times a day (BID) | ORAL | 0 refills | Status: DC
Start: 2023-03-23 — End: 2023-04-24

## 2023-04-24 ENCOUNTER — Other Ambulatory Visit: Payer: Self-pay | Admitting: Internal Medicine

## 2023-04-24 DIAGNOSIS — F900 Attention-deficit hyperactivity disorder, predominantly inattentive type: Secondary | ICD-10-CM

## 2023-04-24 MED ORDER — AMPHETAMINE-DEXTROAMPHETAMINE 30 MG PO TABS
30.0000 mg | ORAL_TABLET | Freq: Two times a day (BID) | ORAL | 0 refills | Status: DC
Start: 2023-04-24 — End: 2023-05-22

## 2023-05-22 ENCOUNTER — Other Ambulatory Visit: Payer: Self-pay | Admitting: Internal Medicine

## 2023-05-22 DIAGNOSIS — F900 Attention-deficit hyperactivity disorder, predominantly inattentive type: Secondary | ICD-10-CM

## 2023-05-22 MED ORDER — AMPHETAMINE-DEXTROAMPHETAMINE 30 MG PO TABS
30.0000 mg | ORAL_TABLET | Freq: Two times a day (BID) | ORAL | 0 refills | Status: DC
Start: 2023-05-22 — End: 2023-06-28

## 2023-06-28 ENCOUNTER — Telehealth: Payer: Self-pay | Admitting: Internal Medicine

## 2023-06-28 ENCOUNTER — Ambulatory Visit: Payer: BC Managed Care – PPO | Admitting: Internal Medicine

## 2023-06-28 ENCOUNTER — Other Ambulatory Visit: Payer: Self-pay | Admitting: Internal Medicine

## 2023-06-28 ENCOUNTER — Encounter: Payer: Self-pay | Admitting: Internal Medicine

## 2023-06-28 VITALS — BP 124/82 | HR 82 | Temp 98.3°F | Resp 16 | Ht 72.0 in | Wt 242.0 lb

## 2023-06-28 DIAGNOSIS — E876 Hypokalemia: Secondary | ICD-10-CM | POA: Insufficient documentation

## 2023-06-28 DIAGNOSIS — I119 Hypertensive heart disease without heart failure: Secondary | ICD-10-CM

## 2023-06-28 DIAGNOSIS — E781 Pure hyperglyceridemia: Secondary | ICD-10-CM | POA: Diagnosis not present

## 2023-06-28 DIAGNOSIS — Z1159 Encounter for screening for other viral diseases: Secondary | ICD-10-CM | POA: Diagnosis not present

## 2023-06-28 DIAGNOSIS — I10 Essential (primary) hypertension: Secondary | ICD-10-CM

## 2023-06-28 DIAGNOSIS — Z0001 Encounter for general adult medical examination with abnormal findings: Secondary | ICD-10-CM

## 2023-06-28 DIAGNOSIS — E785 Hyperlipidemia, unspecified: Secondary | ICD-10-CM | POA: Diagnosis not present

## 2023-06-28 DIAGNOSIS — F900 Attention-deficit hyperactivity disorder, predominantly inattentive type: Secondary | ICD-10-CM

## 2023-06-28 DIAGNOSIS — Z1211 Encounter for screening for malignant neoplasm of colon: Secondary | ICD-10-CM

## 2023-06-28 DIAGNOSIS — Z1231 Encounter for screening mammogram for malignant neoplasm of breast: Secondary | ICD-10-CM

## 2023-06-28 DIAGNOSIS — Z124 Encounter for screening for malignant neoplasm of cervix: Secondary | ICD-10-CM

## 2023-06-28 LAB — CBC WITH DIFFERENTIAL/PLATELET
Basophils Absolute: 0.1 10*3/uL (ref 0.0–0.1)
Basophils Relative: 1.1 % (ref 0.0–3.0)
Eosinophils Absolute: 0.1 10*3/uL (ref 0.0–0.7)
Eosinophils Relative: 1.2 % (ref 0.0–5.0)
HCT: 46.6 % — ABNORMAL HIGH (ref 36.0–46.0)
Hemoglobin: 15.2 g/dL — ABNORMAL HIGH (ref 12.0–15.0)
Lymphocytes Relative: 36.4 % (ref 12.0–46.0)
Lymphs Abs: 3.4 10*3/uL (ref 0.7–4.0)
MCHC: 32.6 g/dL (ref 30.0–36.0)
MCV: 95.2 fl (ref 78.0–100.0)
Monocytes Absolute: 0.5 10*3/uL (ref 0.1–1.0)
Monocytes Relative: 5.9 % (ref 3.0–12.0)
Neutro Abs: 5.1 10*3/uL (ref 1.4–7.7)
Neutrophils Relative %: 55.4 % (ref 43.0–77.0)
Platelets: 294 10*3/uL (ref 150.0–400.0)
RBC: 4.9 Mil/uL (ref 3.87–5.11)
RDW: 15.2 % (ref 11.5–15.5)
WBC: 9.2 10*3/uL (ref 4.0–10.5)

## 2023-06-28 LAB — BASIC METABOLIC PANEL
BUN: 15 mg/dL (ref 6–23)
CO2: 25 mEq/L (ref 19–32)
Calcium: 9.4 mg/dL (ref 8.4–10.5)
Chloride: 102 mEq/L (ref 96–112)
Creatinine, Ser: 1.32 mg/dL — ABNORMAL HIGH (ref 0.40–1.20)
GFR: 46.04 mL/min — ABNORMAL LOW (ref >60.00)
Glucose, Bld: 96 mg/dL (ref 70–99)
Potassium: 3.7 mEq/L (ref 3.5–5.1)
Sodium: 134 mEq/L — ABNORMAL LOW (ref 135–145)

## 2023-06-28 LAB — LIPID PANEL
Cholesterol: 273 mg/dL — ABNORMAL HIGH (ref 0–200)
HDL: 59.3 mg/dL (ref >39.00)
LDL Cholesterol: 188 mg/dL — ABNORMAL HIGH (ref 0–99)
NonHDL: 213.82
Total CHOL/HDL Ratio: 5
Triglycerides: 128 mg/dL (ref 0.0–149.0)
VLDL: 25.6 mg/dL (ref 0.0–40.0)

## 2023-06-28 LAB — MAGNESIUM: Magnesium: 2 mg/dL (ref 1.5–2.5)

## 2023-06-28 MED ORDER — AMPHETAMINE-DEXTROAMPHETAMINE 30 MG PO TABS
30.0000 mg | ORAL_TABLET | Freq: Two times a day (BID) | ORAL | 0 refills | Status: DC
Start: 2023-06-28 — End: 2023-07-03

## 2023-06-28 NOTE — Patient Instructions (Signed)
Hypokalemia Hypokalemia means that the amount of potassium in the blood is lower than normal. Potassium is a mineral (electrolyte) that helps regulate the amount of fluid in the body. It also stimulates muscle tightening (contraction) and helps nerves work properly. Normally, most of the body's potassium is inside cells, and only a very small amount is in the blood. Because the amount in the blood is so small, minor changes to potassium levels in the blood can be life-threatening. What are the causes? This condition may be caused by: Antibiotic medicine. Diarrhea or vomiting. Taking too much of a medicine that helps you have a bowel movement (laxative) can cause diarrhea and lead to hypokalemia. Chronic kidney disease (CKD). Medicines that help the body get rid of excess fluid (diuretics). Eating disorders, such as anorexia or bulimia. Low magnesium levels in the body. Sweating a lot. What are the signs or symptoms? Symptoms of this condition include: Weakness. Constipation. Fatigue. Muscle cramps. Mental confusion. Skipped heartbeats or irregular heartbeat (palpitations). Tingling or numbness. How is this diagnosed? This condition is diagnosed with a blood test. How is this treated? This condition may be treated by: Taking potassium supplements. Adjusting the medicines that you take. Eating more foods that contain a lot of potassium. If your potassium level is very low, you may need to get potassium through an IV and be monitored in the hospital. Follow these instructions at home: Eating and drinking  Eat a healthy diet. A healthy diet includes fresh fruits and vegetables, whole grains, healthy fats, and lean proteins. If told, eat more foods that contain a lot of potassium. These include: Nuts, such as peanuts and pistachios. Seeds, such as sunflower seeds and pumpkin seeds. Peas, lentils, and lima beans. Whole grain and bran cereals and breads. Fresh fruits and vegetables,  such as apricots, avocado, bananas, cantaloupe, kiwi, oranges, tomatoes, asparagus, and potatoes. Juices, such as orange, tomato, and prune. Lean meats, including fish. Milk and milk products, such as yogurt. General instructions Take over-the-counter and prescription medicines only as told by your health care provider. This includes vitamins, natural food products, and supplements. Keep all follow-up visits. This is important. Contact a health care provider if: You have weakness that gets worse. You feel your heart pounding or racing. You vomit. You have diarrhea. You have diabetes and you have trouble keeping your blood sugar in your target range. Get help right away if: You have chest pain. You have shortness of breath. You have vomiting or diarrhea that lasts for more than 2 days. You faint. These symptoms may be an emergency. Get help right away. Call 911. Do not wait to see if the symptoms will go away. Do not drive yourself to the hospital. Summary Hypokalemia means that the amount of potassium in the blood is lower than normal. This condition is diagnosed with a blood test. Hypokalemia may be treated by taking potassium supplements, adjusting the medicines that you take, or eating more foods that are high in potassium. If your potassium level is very low, you may need to get potassium through an IV and be monitored in the hospital. This information is not intended to replace advice given to you by your health care provider. Make sure you discuss any questions you have with your health care provider. Document Revised: 07/15/2021 Document Reviewed: 07/15/2021 Elsevier Patient Education  2024 [employer]  

## 2023-06-28 NOTE — Progress Notes (Signed)
Subjective:  Patient ID: Toni Dougherty, female    DOB: [birth year]  Age: 52 y.o. MRN: [id number]  CC: Hypertension   HPI Toni Dougherty presents for f/up -----  Discussed the use of AI scribe software for clinical note transcription with the patient, who gave verbal consent to proceed.  History of Present Illness   The patient, with a history of hypertension and ADHD, recently experienced a severe illness during a trip to [facility]. Upon arrival, she felt disoriented and unwell, which progressed to vomiting and diarrhea over the next five days. She was unable to keep down any food or drink, including cold water, which led to severe dehydration. She also experienced difficulty walking and breathing, along with cold sweats. The symptoms improved upon returning to [place], and she has since returned to work.  The patient was told that the illness was likely due to altitude sickness. She also noted that her potassium levels were significantly low during the illness, and she suspects that they may still be low. She has been experiencing severe nocturnal leg cramps, which cause significant pain and muscle soreness the following day. She is considering starting potassium supplements.  The patient also has a history of smoking and expressed a desire to quit. She did not smoke during her illness in [facility] and has smoked since returning home. She also mentioned that she has "white coat syndrome," with elevated blood pressure during medical appointments due to anxiety.  The patient is currently on Adderall for ADHD and has requested to increase the dosage to three times a day. She also continues to have regular menstrual cycles.       Outpatient Medications Prior to Visit  Medication Sig Dispense Refill   nebivolol (BYSTOLIC) 5 MG tablet Take 1 tablet (5 mg total) by mouth daily. 90 tablet 0   amphetamine-dextroamphetamine (ADDERALL) 30 MG tablet Take 1 tablet by mouth 2 (two) times daily. 60 tablet  0   ondansetron (ZOFRAN-ODT) 4 MG disintegrating tablet Take 1 tablet (4 mg total) by mouth every 8 (eight) hours as needed for nausea or vomiting. 10 tablet 0   No facility-administered medications prior to visit.    ROS Review of Systems  Constitutional:  Positive for unexpected weight change (wt gain). Negative for appetite change, chills, diaphoresis and fatigue.  Respiratory: Negative.  Negative for choking, shortness of breath and wheezing.   Cardiovascular:  Negative for chest pain and leg swelling.  Gastrointestinal: Negative.  Negative for abdominal pain, constipation, diarrhea and nausea.  Genitourinary: Negative.  Negative for difficulty urinating.  Musculoskeletal: Negative.   Skin: Negative.   Neurological: Negative.  Negative for dizziness, weakness and headaches.  Hematological:  Negative for adenopathy. Does not bruise/bleed easily.  Psychiatric/Behavioral:  Positive for decreased concentration. Negative for confusion, dysphoric mood and suicidal ideas.     Objective:  BP 124/82 (BP Location: Right Arm, Patient Position: Sitting, Cuff Size: Large)   Pulse 82   Temp 98.3 F (36.8 C) (Oral)   Resp 16   Ht 6' (1.829 m)   Wt 242 lb (109.8 kg) Comment: declined  LMP 06/15/2023 (Exact Date)   SpO2 97%   BMI 32.82 kg/m   BP Readings from Last 3 Encounters:  06/28/23 124/82  03/21/23 129/77  12/27/22 128/80    Wt Readings from Last 3 Encounters:  06/28/23 242 lb (109.8 kg)  03/21/23 240 lb (108.9 kg)  12/27/22 235 lb (106.6 kg)    Physical Exam Vitals reviewed.  HENT:  Nose: Nose normal.     Mouth/Throat:     Mouth: Mucous membranes are moist.  Eyes:     General: No scleral icterus.    Conjunctiva/sclera: Conjunctivae normal.  Cardiovascular:     Rate and Rhythm: Normal rate and regular rhythm.     Heart sounds: No murmur heard. Pulmonary:     Effort: Pulmonary effort is normal.     Breath sounds: No stridor. No wheezing, rhonchi or rales.   Abdominal:     General: Abdomen is protuberant. Bowel sounds are normal. There is no distension.     Palpations: There is no hepatomegaly or splenomegaly.     Tenderness: There is no abdominal tenderness.  Musculoskeletal:     Cervical back: Neck supple.  Lymphadenopathy:     Cervical: No cervical adenopathy.  Skin:    General: Skin is warm.     Findings: No lesion or rash.  Neurological:     General: No focal deficit present.     Mental Status: She is alert. Mental status is at baseline.  Psychiatric:        Mood and Affect: Mood normal.        Behavior: Behavior normal.        Thought Content: Thought content normal.        Judgment: Judgment normal.     Lab Results  Component Value Date   WBC 9.2 06/28/2023   HGB 15.2 (H) 06/28/2023   HCT 46.6 (H) 06/28/2023   PLT 294.0 06/28/2023   GLUCOSE 96 06/28/2023   CHOL 273 (H) 06/28/2023   TRIG 128.0 06/28/2023   HDL 59.30 06/28/2023   LDLCALC 188 (H) 06/28/2023   ALT 19 03/21/2023   AST 24 03/21/2023   NA 134 (L) 06/28/2023   K 3.7 06/28/2023   CL 102 06/28/2023   CREATININE 1.32 (H) 06/28/2023   BUN 15 06/28/2023   CO2 25 06/28/2023   TSH 1.30 09/23/2022   HGBA1C 5.8 09/23/2022    No results found.  Assessment & Plan:  Need for hepatitis C screening test -     Hepatitis C antibody; Future  LVH (left ventricular hypertrophy) due to hypertensive disease, without heart failure  Primary hypertension- Her BP is well controlled. -     Basic metabolic panel; Future -     CBC with Differential/Platelet; Future  Screen for colon cancer -     Ambulatory referral to Gastroenterology  Screening mammogram for breast cancer -     Digital Screening Mammogram, Left and Right; Future  Hypokalemia -     Basic metabolic panel; Future -     Magnesium; Future  Attention deficit hyperactivity disorder (ADHD), predominantly inattentive type -     Amphetamine-Dextroamphetamine; Take 1 tablet by mouth 2 (two) times daily.   Dispense: 60 tablet; Refill: 0  Pure hyperglyceridemia -     Lipid panel; Future  Cervical cancer screening -     Ambulatory referral to Gynecology  Encounter for general adult medical examination with abnormal findings  Dyslipidemia, goal LDL below 130- Will start a statin for CV risk reduction. -     Rosuvastatin Calcium; Take 1 tablet (20 mg total) by mouth daily.  Dispense: 90 tablet; Refill: 1     Follow-up: Return in about 3 months (around 09/28/2023).  Sanda Linger, MD

## 2023-06-28 NOTE — Telephone Encounter (Signed)
Pt mentioned she ran out of Adderall and would like to request a refill.

## 2023-06-29 ENCOUNTER — Ambulatory Visit: Payer: BC Managed Care – PPO | Admitting: Internal Medicine

## 2023-06-29 DIAGNOSIS — E785 Hyperlipidemia, unspecified: Secondary | ICD-10-CM | POA: Insufficient documentation

## 2023-06-29 LAB — HEPATITIS C ANTIBODY: Hepatitis C Ab: NONREACTIVE

## 2023-06-29 MED ORDER — ROSUVASTATIN CALCIUM 20 MG PO TABS
20.0000 mg | ORAL_TABLET | Freq: Every day | ORAL | 1 refills | Status: DC
Start: 2023-06-29 — End: 2023-07-03

## 2023-07-03 ENCOUNTER — Other Ambulatory Visit (HOSPITAL_BASED_OUTPATIENT_CLINIC_OR_DEPARTMENT_OTHER): Payer: Self-pay

## 2023-07-03 ENCOUNTER — Encounter (HOSPITAL_BASED_OUTPATIENT_CLINIC_OR_DEPARTMENT_OTHER): Payer: Self-pay

## 2023-07-03 ENCOUNTER — Other Ambulatory Visit: Payer: Self-pay

## 2023-07-03 MED ORDER — AMPHETAMINE-DEXTROAMPHETAMINE 30 MG PO TABS
30.0000 mg | ORAL_TABLET | Freq: Two times a day (BID) | ORAL | 0 refills | Status: DC
Start: 2023-07-03 — End: 2023-07-31
  Filled 2023-07-03: qty 60, 30d supply, fill #0

## 2023-07-03 MED ORDER — ROSUVASTATIN CALCIUM 20 MG PO TABS
20.0000 mg | ORAL_TABLET | Freq: Every day | ORAL | 1 refills | Status: AC
  Filled 2023-07-03 – 2023-08-29 (×2): qty 90, 90d supply, fill #0

## 2023-07-03 NOTE — Telephone Encounter (Signed)
Pt states that she needs her Adderall sent to the correct pharmacy and that pharmacy is [facility].

## 2023-07-04 ENCOUNTER — Other Ambulatory Visit (HOSPITAL_BASED_OUTPATIENT_CLINIC_OR_DEPARTMENT_OTHER): Payer: Self-pay

## 2023-07-19 ENCOUNTER — Other Ambulatory Visit (HOSPITAL_BASED_OUTPATIENT_CLINIC_OR_DEPARTMENT_OTHER): Payer: Self-pay

## 2023-07-21 ENCOUNTER — Inpatient Hospital Stay: Admission: RE | Admit: 2023-07-21 | Payer: BC Managed Care – PPO | Source: Ambulatory Visit

## 2023-07-31 ENCOUNTER — Other Ambulatory Visit: Payer: Self-pay | Admitting: Internal Medicine

## 2023-07-31 DIAGNOSIS — F900 Attention-deficit hyperactivity disorder, predominantly inattentive type: Secondary | ICD-10-CM

## 2023-08-01 ENCOUNTER — Other Ambulatory Visit (HOSPITAL_BASED_OUTPATIENT_CLINIC_OR_DEPARTMENT_OTHER): Payer: Self-pay

## 2023-08-01 MED ORDER — AMPHETAMINE-DEXTROAMPHETAMINE 30 MG PO TABS
30.0000 mg | ORAL_TABLET | Freq: Two times a day (BID) | ORAL | 0 refills | Status: DC
Start: 2023-08-01 — End: 2023-08-29
  Filled 2023-08-01: qty 60, 30d supply, fill #0

## 2023-08-29 ENCOUNTER — Other Ambulatory Visit (HOSPITAL_BASED_OUTPATIENT_CLINIC_OR_DEPARTMENT_OTHER): Payer: Self-pay

## 2023-08-29 ENCOUNTER — Other Ambulatory Visit: Payer: Self-pay

## 2023-08-29 ENCOUNTER — Other Ambulatory Visit: Payer: Self-pay | Admitting: Internal Medicine

## 2023-08-29 DIAGNOSIS — F900 Attention-deficit hyperactivity disorder, predominantly inattentive type: Secondary | ICD-10-CM

## 2023-08-29 MED ORDER — AMPHETAMINE-DEXTROAMPHETAMINE 30 MG PO TABS
30.0000 mg | ORAL_TABLET | Freq: Two times a day (BID) | ORAL | 0 refills | Status: DC
Start: 2023-08-29 — End: 2023-09-21
  Filled 2023-08-29: qty 60, 30d supply, fill #0

## 2023-09-21 ENCOUNTER — Other Ambulatory Visit: Payer: Self-pay | Admitting: Internal Medicine

## 2023-09-21 DIAGNOSIS — F900 Attention-deficit hyperactivity disorder, predominantly inattentive type: Secondary | ICD-10-CM

## 2023-09-22 ENCOUNTER — Other Ambulatory Visit (HOSPITAL_BASED_OUTPATIENT_CLINIC_OR_DEPARTMENT_OTHER): Payer: Self-pay

## 2023-09-22 MED ORDER — AMPHETAMINE-DEXTROAMPHETAMINE 30 MG PO TABS
30.0000 mg | ORAL_TABLET | Freq: Two times a day (BID) | ORAL | 0 refills | Status: DC
Start: 2023-09-22 — End: 2023-11-16
  Filled 2023-09-22 – 2023-10-03 (×2): qty 60, 30d supply, fill #0

## 2023-10-03 ENCOUNTER — Encounter (HOSPITAL_BASED_OUTPATIENT_CLINIC_OR_DEPARTMENT_OTHER): Payer: Self-pay

## 2023-10-03 ENCOUNTER — Other Ambulatory Visit (HOSPITAL_BASED_OUTPATIENT_CLINIC_OR_DEPARTMENT_OTHER): Payer: Self-pay

## 2023-11-02 ENCOUNTER — Ambulatory Visit: Payer: Self-pay | Admitting: Internal Medicine

## 2023-11-02 ENCOUNTER — Other Ambulatory Visit: Payer: Self-pay | Admitting: Internal Medicine

## 2023-11-02 ENCOUNTER — Telehealth: Payer: Self-pay | Admitting: Internal Medicine

## 2023-11-02 ENCOUNTER — Telehealth: Payer: Self-pay

## 2023-11-02 ENCOUNTER — Other Ambulatory Visit (HOSPITAL_BASED_OUTPATIENT_CLINIC_OR_DEPARTMENT_OTHER): Payer: Self-pay

## 2023-11-02 DIAGNOSIS — F900 Attention-deficit hyperactivity disorder, predominantly inattentive type: Secondary | ICD-10-CM

## 2023-11-02 NOTE — Telephone Encounter (Signed)
 Medication refill sent to Dr. Yetta Barre

## 2023-11-02 NOTE — Telephone Encounter (Signed)
ADDERALL MEDICATION   Called to initially speak with the patient in regards to the note I received from the triage line stating that the patient was expressing suicidal thoughts. When I got the patient on the line I asked her was she having any suicidal thoughts and did she want to harm herself. She said "no, why does that matter? It doesn't matter if I wanted to harm myself or blow everybody in this building up, Just give me my medicine." I advised her that if she was having any suicidal thought I would send the police to do a wellness check per Dr. Yetta Barre wishes. She asked me not to send the police and made it clear she was not having any suicidal thoughts and just wanted her medication. I begin to explain the medication refill to her and also advised her that I didn't receive a refill request from her Monday I received it "yesterday" which I had made a mistake because today's date was on the telephone note that I received. I corrected the date and tried to explain the process again, She then cut me off and told me that I'm a liar and I told her 2 different stories and begin to curse me out. As she was cursing me out I was responding "Okay, your correct, would you like me to continue what I was saying?" She then told me She didn't need the sarcasm from me at all and continued cursing me out and hung up the phone. Dr. Yetta Barre then denied her medication due to her needing an appointment and I called her back to inform her of that and she advised me that Dr. Yetta Barre didn't deny her medication and somebody should've told her earlier today that she needed an appointment to receive a refill on her medication. I tried to speak she wouldn't let me, she told me to go to "blood hell" and disconnected the call. I have advised Zerita Boers and Dr. Yetta Barre of this conversation. If patient decides to call back she needs to schedule an appointment to have her medication refill per Dr. Yetta Barre

## 2023-11-02 NOTE — Telephone Encounter (Signed)
Copied from CRM [id number]. Topic: Clinical - Medication Refill >> Nov 02, 2023 12:28 PM Hector Shade B wrote: Most Recent Primary Care Visit:  Provider: Etta Grandchild  Department: [facility]  Visit Type: OFFICE VISIT  Date: 06/28/2023  Medication: amphetamine-dextroamphetamine (ADDERALL) 30 MG tablet  Has the patient contacted their pharmacy? Yes  (Agent: If no, request that the patient contact the pharmacy for the refill. If patient does not wish to contact the pharmacy document the reason why and proceed with request.) (Agent: If yes, when and what did the pharmacy advise?) Patient was calling from the pharmacy she went to pick it up thinking it was refilled.  Patient was instructed to call the provider because the pharmacist stated it showed pending status at this time Is this the correct pharmacy for this prescription? Yes If no, delete pharmacy and type the correct one.  This is the patient's preferred pharmacy:  [facility] [id number] - [place], [place] - [address] [place] [address] Phone: [phone] Fax: [phone]  [facility] [address] [place] [address] Phone: [phone] Fax: [phone]   Has the prescription been filled recently? Yes  Is the patient out of the medication? Yes,   Has the patient been seen for an appointment in the last year OR does the patient have an upcoming appointment? Yes  Can we respond through MyChart? Yes  Agent: Please be advised that Rx refills may take up to 3 business days. We ask that you follow-up with your pharmacy.

## 2023-11-02 NOTE — Telephone Encounter (Signed)
Patient was cold transferred to nurse triage line directly. Patient stated her name and DOB and that she needed her Adderall refilled. Patient stated that her provider needed to sign the prescription. I informed the patient that the medication refill request had already been sent over this afternoon (via her chart). Patient was adamant that she needed her Adderall now. I offered to call the office via the CAL myself. I was able to get in touch with Dr. Barnett Applebaum CMA who stated that the patient was already on the other line. I transferred the patient.

## 2023-11-02 NOTE — Telephone Encounter (Signed)
Copied from CRM [id number]. Topic: Clinical - Red Word Triage >> Nov 02, 2023  3:59 PM Toni Dougherty wrote: Red Word that prompted transfer to Nurse Triage: Pt has mentioned having issues with her Adderall script due to history of narcolepsy and ADHD. She is expressing it could become a mental health issue and is expressing sucidal thoughts  Reason for Disposition  Caller hangs up  Answer Assessment - Initial Assessment Questions 1. SITUATION:  Document reason for call.     Patient called for an update on her refill request for Adderall. She stated she has been trying to get this prescription since Monday.  2. BACKGROUND: Document any background information (e.g., prior calls, known psychiatric history)     Patient reported having Narcolepsy and ADHD.  3. ASSESSMENT: Document your nursing assessment.     Agent transferred call to this RN stating that patient mentioned suicidal thoughts. Once I had the patient on the line, I was unable to complete an assessment due to patient being uncooperative. I asked whether patient had thoughts of harming herself or others. Patient declined to answer and stated "I just want my meds."  4. RESPONSE: Document what your response or recommendation was.     Unable to triage patient. I apologized to patient for any delay/inconvenience and informed patient that I will notify the office and send a message to try and help with this situation as soon as possible. Patient was not receptive to this information. She expressed her frustration with having to go through repeated attempts to try to get her medication. I asked if I can assist patient further. Patient hung up.   I placed a call to the clinic access line and spoke to [place] to make the office aware of this situation.  Protocols used: Difficult Call-A-AH

## 2023-11-02 NOTE — Telephone Encounter (Signed)
Caller & Relationship to patient: SELF  Call back number: [phone]  Date of last office visit: 8.15.24  Date of next office visit: N/A  Medication(s) to be refilled:  amphetamine-dextroamphetamine (ADDERALL) 30 MG tablet   Preferred Pharmacy:   [facility]   Phone: [phone]  Fax: [phone]

## 2023-11-06 NOTE — Telephone Encounter (Signed)
Called patient per provider request to make her aware tha he has discontinued her medciation. Patient stated she needs this medication and is willing to come in for an OV. Encouraged that during next visit she setup a 3 month f/u.  Patient scheduled for 1/23 @140  for OV to see Dr. Yetta Barre and is very frustrated as she is not going to make it that long without her meds.

## 2023-11-16 ENCOUNTER — Ambulatory Visit (INDEPENDENT_AMBULATORY_CARE_PROVIDER_SITE_OTHER): Payer: BC Managed Care – PPO | Admitting: Internal Medicine

## 2023-11-16 ENCOUNTER — Other Ambulatory Visit (HOSPITAL_BASED_OUTPATIENT_CLINIC_OR_DEPARTMENT_OTHER): Payer: Self-pay

## 2023-11-16 ENCOUNTER — Other Ambulatory Visit: Payer: Self-pay

## 2023-11-16 VITALS — BP 160/100 | HR 74 | Temp 98.3°F | Ht 72.0 in | Wt 239.0 lb

## 2023-11-16 DIAGNOSIS — I10 Essential (primary) hypertension: Secondary | ICD-10-CM

## 2023-11-16 DIAGNOSIS — Z1211 Encounter for screening for malignant neoplasm of colon: Secondary | ICD-10-CM | POA: Insufficient documentation

## 2023-11-16 DIAGNOSIS — E785 Hyperlipidemia, unspecified: Secondary | ICD-10-CM

## 2023-11-16 DIAGNOSIS — Z124 Encounter for screening for malignant neoplasm of cervix: Secondary | ICD-10-CM

## 2023-11-16 DIAGNOSIS — F900 Attention-deficit hyperactivity disorder, predominantly inattentive type: Secondary | ICD-10-CM | POA: Diagnosis not present

## 2023-11-16 DIAGNOSIS — Z114 Encounter for screening for human immunodeficiency virus [HIV]: Secondary | ICD-10-CM | POA: Insufficient documentation

## 2023-11-16 DIAGNOSIS — I119 Hypertensive heart disease without heart failure: Secondary | ICD-10-CM

## 2023-11-16 DIAGNOSIS — Z720 Tobacco use: Secondary | ICD-10-CM

## 2023-11-16 DIAGNOSIS — Z1231 Encounter for screening mammogram for malignant neoplasm of breast: Secondary | ICD-10-CM

## 2023-11-16 MED ORDER — TORSEMIDE 20 MG PO TABS
20.0000 mg | ORAL_TABLET | Freq: Two times a day (BID) | ORAL | 0 refills | Status: DC
  Filled 2023-11-16: qty 60, 30d supply, fill #0

## 2023-11-16 MED ORDER — NEBIVOLOL HCL 5 MG PO TABS
5.0000 mg | ORAL_TABLET | Freq: Every day | ORAL | 0 refills | Status: DC
  Filled 2023-11-16: qty 30, 30d supply, fill #0

## 2023-11-16 MED ORDER — AMPHETAMINE-DEXTROAMPHET ER 20 MG PO CP24
20.0000 mg | ORAL_CAPSULE | ORAL | 0 refills | Status: DC
  Filled 2023-11-16: qty 30, 30d supply, fill #0

## 2023-11-16 MED ORDER — POTASSIUM CHLORIDE ER 10 MEQ PO TBCR
10.0000 meq | EXTENDED_RELEASE_TABLET | Freq: Two times a day (BID) | ORAL | 0 refills | Status: DC
  Filled 2023-11-16: qty 60, 30d supply, fill #0

## 2023-11-16 NOTE — Patient Instructions (Signed)
 Hypertension, Adult High blood pressure (hypertension) is when the force of blood pumping through the arteries is too strong. The arteries are the blood vessels that carry blood from the heart throughout the body. Hypertension forces the heart to work harder to pump blood and may cause arteries to become narrow or stiff. Untreated or uncontrolled hypertension can lead to a heart attack, heart failure, a stroke, kidney disease, and other problems. A blood pressure reading consists of a higher number over a lower number. Ideally, your blood pressure should be below 120/80. The first ("top") number is called the systolic pressure. It is a measure of the pressure in your arteries as your heart beats. The second ("bottom") number is called the diastolic pressure. It is a measure of the pressure in your arteries as the heart relaxes. What are the causes? The exact cause of this condition is not known. There are some conditions that result in high blood pressure. What increases the risk? Certain factors may make you more likely to develop high blood pressure. Some of these risk factors are under your control, including: Smoking. Not getting enough exercise or physical activity. Being overweight. Having too much fat, sugar, calories, or salt (sodium) in your diet. Drinking too much alcohol. Other risk factors include: Having a personal history of heart disease, diabetes, high cholesterol, or kidney disease. Stress. Having a family history of high blood pressure and high cholesterol. Having obstructive sleep apnea. Age. The risk increases with age. What are the signs or symptoms? High blood pressure may not cause symptoms. Very high blood pressure (hypertensive crisis) may cause: Headache. Fast or irregular heartbeats (palpitations). Shortness of breath. Nosebleed. Nausea and vomiting. Vision changes. Severe chest pain, dizziness, and seizures. How is this diagnosed? This condition is diagnosed by  measuring your blood pressure while you are seated, with your arm resting on a flat surface, your legs uncrossed, and your feet flat on the floor. The cuff of the blood pressure monitor will be placed directly against the skin of your upper arm at the level of your heart. Blood pressure should be measured at least twice using the same arm. Certain conditions can cause a difference in blood pressure between your right and left arms. If you have a high blood pressure reading during one visit or you have normal blood pressure with other risk factors, you may be asked to: Return on a different day to have your blood pressure checked again. Monitor your blood pressure at home for 1 week or longer. If you are diagnosed with hypertension, you may have other blood or imaging tests to help your health care provider understand your overall risk for other conditions. How is this treated? This condition is treated by making healthy lifestyle changes, such as eating healthy foods, exercising more, and reducing your alcohol intake. You may be referred for counseling on a healthy diet and physical activity. Your health care provider may prescribe medicine if lifestyle changes are not enough to get your blood pressure under control and if: Your systolic blood pressure is above 130. Your diastolic blood pressure is above 80. Your personal target blood pressure may vary depending on your medical conditions, your age, and other factors. Follow these instructions at home: Eating and drinking  Eat a diet that is high in fiber and potassium, and low in sodium, added sugar, and fat. An example of this eating plan is called the DASH diet. DASH stands for Dietary Approaches to Stop Hypertension. To eat this way: Eat  plenty of fresh fruits and vegetables. Try to fill one half of your plate at each meal with fruits and vegetables. Eat whole grains, such as whole-wheat pasta, brown rice, or whole-grain bread. Fill about one  fourth of your plate with whole grains. Eat or drink low-fat dairy products, such as skim milk or low-fat yogurt. Avoid fatty cuts of meat, processed or cured meats, and poultry with skin. Fill about one fourth of your plate with lean proteins, such as fish, chicken without skin, beans, eggs, or tofu. Avoid pre-made and processed foods. These tend to be higher in sodium, added sugar, and fat. Reduce your daily sodium intake. Many people with hypertension should eat less than 1,500 mg of sodium a day. Do not drink alcohol if: Your health care provider tells you not to drink. You are pregnant, may be pregnant, or are planning to become pregnant. If you drink alcohol: Limit how much you have to: 0-1 drink a day for women. 0-2 drinks a day for men. Know how much alcohol is in your drink. In the [place] one drink equals one 12 oz bottle of beer (355 mL), one 5 oz glass of wine (148 mL), or one 1 oz glass of hard liquor (44 mL). Lifestyle  Work with your health care provider to maintain a healthy body weight or to lose weight. Ask what an ideal weight is for you. Get at least 30 minutes of exercise that causes your heart to beat faster (aerobic exercise) most days of the week. Activities may include walking, swimming, or biking. Include exercise to strengthen your muscles (resistance exercise), such as Pilates or lifting weights, as part of your weekly exercise routine. Try to do these types of exercises for 30 minutes at least 3 days a week. Do not use any products that contain nicotine or tobacco. These products include cigarettes, chewing tobacco, and vaping devices, such as e-cigarettes. If you need help quitting, ask your health care provider. Monitor your blood pressure at home as told by your health care provider. Keep all follow-up visits. This is important. Medicines Take over-the-counter and prescription medicines only as told by your health care provider. Follow directions carefully. Blood  pressure medicines must be taken as prescribed. Do not skip doses of blood pressure medicine. Doing this puts you at risk for problems and can make the medicine less effective. Ask your health care provider about side effects or reactions to medicines that you should watch for. Contact a health care provider if you: Think you are having a reaction to a medicine you are taking. Have headaches that keep coming back (recurring). Feel dizzy. Have swelling in your ankles. Have trouble with your vision. Get help right away if you: Develop a severe headache or confusion. Have unusual weakness or numbness. Feel faint. Have severe pain in your chest or abdomen. Vomit repeatedly. Have trouble breathing. These symptoms may be an emergency. Get help right away. Call 911. Do not wait to see if the symptoms will go away. Do not drive yourself to the hospital. Summary Hypertension is when the force of blood pumping through your arteries is too strong. If this condition is not controlled, it may put you at risk for serious complications. Your personal target blood pressure may vary depending on your medical conditions, your age, and other factors. For most people, a normal blood pressure is less than 120/80. Hypertension is treated with lifestyle changes, medicines, or a combination of both. Lifestyle changes include losing weight, eating a healthy,  low-sodium diet, exercising more, and limiting alcohol. This information is not intended to replace advice given to you by your health care provider. Make sure you discuss any questions you have with your health care provider. Document Revised: 09/07/2021 Document Reviewed: 09/07/2021 Elsevier Patient Education  2024 [employer].

## 2023-11-16 NOTE — Progress Notes (Signed)
 Subjective:  Patient ID: Toni Dougherty, female    DOB: 1971/08/05  Age: 53 y.o. MRN: [id number]  CC: Hypertension and Hyperlipidemia   HPI [name] presents for f/up ---  Discussed the use of AI scribe software for clinical note transcription with the patient, who gave verbal consent to proceed.  History of Present Illness   The patient, with a history of ADHD and hypertension, presents with concerns about medication refills and elevated blood pressure. She reports a recent episode of feeling suicidal and homicidal, which she attributes to difficulties in obtaining her ADHD medication. She denies any symptoms of headache, blurred vision, chest pain, or shortness of breath.  The patient describes a stressful situation involving a delay in medication refill, which she believes has contributed to her elevated blood pressure. She expresses frustration with the process and interactions with the office staff, which she feels have exacerbated her stress and subsequently her blood pressure. She denies any recent changes in her medication regimen, which includes amphetamines for ADHD.  The patient also reports a smoking history, but notes a reduction in her smoking habits. She admits to occasional alcohol consumption, approximately once or twice a month. She denies any recent vaccinations or preventative screenings, citing a lack of time.  The patient expresses a strong need for her ADHD medication to function in her job and maintain her health coverage. She expresses concern that without her medication, her job performance and subsequently her health coverage could be jeopardized, leading to further health issues.       Outpatient Medications Prior to Visit  Medication Sig Dispense Refill   rosuvastatin  (CRESTOR ) 20 MG tablet Take 1 tablet (20 mg total) by mouth daily. 90 tablet 1   amphetamine -dextroamphetamine  (ADDERALL) 30 MG tablet Take 1 tablet by mouth 2 (two) times daily. 60 tablet 0    nebivolol  (BYSTOLIC ) 5 MG tablet Take 1 tablet (5 mg total) by mouth daily. 90 tablet 0   No facility-administered medications prior to visit.    ROS Review of Systems  Constitutional:  Negative for chills, diaphoresis and fatigue.  HENT: Negative.    Eyes:  Negative for visual disturbance.  Respiratory: Negative.  Negative for cough, chest tightness, shortness of breath and wheezing.   Cardiovascular:  Negative for chest pain, palpitations and leg swelling.  Gastrointestinal:  Negative for abdominal pain, constipation, diarrhea, nausea and vomiting.  Genitourinary:  Negative for difficulty urinating.  Musculoskeletal: Negative.  Negative for arthralgias.  Skin: Negative.   Neurological: Negative.  Negative for dizziness and weakness.  Hematological:  Negative for adenopathy. Does not bruise/bleed easily.  Psychiatric/Behavioral:  Positive for decreased concentration and dysphoric mood. Negative for behavioral problems, confusion, self-injury, sleep disturbance and suicidal ideas. The patient is not nervous/anxious.     Objective:  BP (!) 160/100 (BP Location: Left Arm, Patient Position: Sitting, Cuff Size: Normal)   Pulse 74   Temp 98.3 F (36.8 C) (Oral)   Ht 6' (1.829 m)   Wt 239 lb (108.4 kg)   SpO2 96%   BMI 32.41 kg/m   BP Readings from Last 3 Encounters:  11/16/23 (!) 160/100  06/28/23 124/82  03/21/23 129/77    Wt Readings from Last 3 Encounters:  11/16/23 239 lb (108.4 kg)  06/28/23 242 lb (109.8 kg)  03/21/23 240 lb (108.9 kg)    Physical Exam Vitals reviewed.  Constitutional:      General: She is not in acute distress.    Appearance: She is not ill-appearing, toxic-appearing or  diaphoretic.  HENT:     Nose: Nose normal.     Mouth/Throat:     Mouth: Mucous membranes are moist.  Eyes:     General: No scleral icterus.    Conjunctiva/sclera: Conjunctivae normal.  Cardiovascular:     Rate and Rhythm: Normal rate and regular rhythm.     Heart sounds: No  murmur heard. Pulmonary:     Effort: Pulmonary effort is normal.     Breath sounds: No stridor. No wheezing, rhonchi or rales.  Abdominal:     General: Abdomen is flat.     Palpations: There is no mass.     Tenderness: There is no abdominal tenderness. There is no guarding.     Hernia: No hernia is present.  Musculoskeletal:        General: Normal range of motion.     Cervical back: Neck supple.     Right lower leg: No edema.     Left lower leg: No edema.  Lymphadenopathy:     Cervical: No cervical adenopathy.  Skin:    General: Skin is warm and dry.  Neurological:     General: No focal deficit present.     Mental Status: She is alert. Mental status is at baseline.  Psychiatric:        Mood and Affect: Mood normal.        Behavior: Behavior normal.     Lab Results  Component Value Date   WBC 9.2 06/28/2023   HGB 15.2 (H) 06/28/2023   HCT 46.6 (H) 06/28/2023   PLT 294.0 06/28/2023   GLUCOSE 96 06/28/2023   CHOL 273 (H) 06/28/2023   TRIG 128.0 06/28/2023   HDL 59.30 06/28/2023   LDLCALC 188 (H) 06/28/2023   ALT 19 03/21/2023   AST 24 03/21/2023   NA 134 (L) 06/28/2023   K 3.7 06/28/2023   CL 102 06/28/2023   CREATININE 1.32 (H) 06/28/2023   BUN 15 06/28/2023   CO2 25 06/28/2023   TSH 1.30 09/23/2022   HGBA1C 5.8 09/23/2022    No results found.  Assessment & Plan:    Primary hypertension- Her BP is not at goal. Will restart the BB and start a loop diuretic. -     TSH; Future -     Urinalysis, Routine w reflex microscopic; Future -     CBC with Differential/Platelet; Future -     Basic metabolic panel; Future -     Nebivolol  HCl; Take 1 tablet (5 mg total) by mouth daily.  Dispense: 30 tablet; Refill: 0 -     Torsemide ; Take 1 tablet (20 mg total) by mouth 2 (two) times daily.  Dispense: 60 tablet; Refill: 0 -     Potassium Chloride  ER; Take 1 tablet (10 mEq total) by mouth 2 (two) times daily.  Dispense: 60 tablet; Refill: 0  Dyslipidemia, goal LDL below  130 -     Lipid panel; Future -     TSH; Future -     Hepatic function panel; Future  Cervical cancer screening -     Ambulatory referral to Gynecology  Screening for colon cancer -     Ambulatory referral to Gastroenterology  Screening mammogram for breast cancer -     Digital Screening Mammogram, Left and Right; Future  Encounter for screening for HIV -     HIV Antibody (routine testing w rflx); Future  LVH (left ventricular hypertrophy) due to hypertensive disease, without heart failure -     Nebivolol   HCl; Take 1 tablet (5 mg total) by mouth daily.  Dispense: 30 tablet; Refill: 0 -     Torsemide ; Take 1 tablet (20 mg total) by mouth 2 (two) times daily.  Dispense: 60 tablet; Refill: 0  Tobacco abuse -     Ambulatory Referral for Lung Cancer Scre  Attention deficit hyperactivity disorder (ADHD), predominantly inattentive type- Her BP is high. Will lower the adderall dose. -     Amphetamine -Dextroamphet ER; Take 1 capsule (20 mg total) by mouth every morning.  Dispense: 30 capsule; Refill: 0     Follow-up: Return in about 4 weeks (around 12/14/2023).  Debby Molt, MD

## 2023-11-27 ENCOUNTER — Other Ambulatory Visit (HOSPITAL_BASED_OUTPATIENT_CLINIC_OR_DEPARTMENT_OTHER): Payer: Self-pay

## 2023-12-07 ENCOUNTER — Ambulatory Visit: Payer: BC Managed Care – PPO | Admitting: Internal Medicine

## 2023-12-13 ENCOUNTER — Other Ambulatory Visit (HOSPITAL_BASED_OUTPATIENT_CLINIC_OR_DEPARTMENT_OTHER): Payer: Self-pay

## 2023-12-13 ENCOUNTER — Other Ambulatory Visit: Payer: Self-pay | Admitting: Internal Medicine

## 2023-12-13 DIAGNOSIS — F900 Attention-deficit hyperactivity disorder, predominantly inattentive type: Secondary | ICD-10-CM

## 2023-12-13 MED ORDER — AMPHETAMINE-DEXTROAMPHET ER 20 MG PO CP24
20.0000 mg | ORAL_CAPSULE | ORAL | 0 refills | Status: DC
  Filled 2023-12-13 – 2023-12-18 (×2): qty 30, 30d supply, fill #0

## 2023-12-18 ENCOUNTER — Other Ambulatory Visit: Payer: Self-pay

## 2023-12-18 ENCOUNTER — Other Ambulatory Visit (HOSPITAL_BASED_OUTPATIENT_CLINIC_OR_DEPARTMENT_OTHER): Payer: Self-pay

## 2023-12-21 ENCOUNTER — Ambulatory Visit: Payer: BC Managed Care – PPO | Admitting: Internal Medicine

## 2023-12-29 ENCOUNTER — Other Ambulatory Visit (HOSPITAL_BASED_OUTPATIENT_CLINIC_OR_DEPARTMENT_OTHER): Payer: Self-pay

## 2023-12-29 DIAGNOSIS — Z1389 Encounter for screening for other disorder: Secondary | ICD-10-CM | POA: Diagnosis not present

## 2023-12-29 DIAGNOSIS — F900 Attention-deficit hyperactivity disorder, predominantly inattentive type: Secondary | ICD-10-CM | POA: Diagnosis not present

## 2023-12-29 DIAGNOSIS — I10 Essential (primary) hypertension: Secondary | ICD-10-CM | POA: Diagnosis not present

## 2023-12-29 MED ORDER — AMPHETAMINE-DEXTROAMPHETAMINE 10 MG PO TABS
10.0000 mg | ORAL_TABLET | Freq: Every day | ORAL | 0 refills | Status: DC | PRN
  Filled 2023-12-29: qty 30, 30d supply, fill #0

## 2023-12-29 MED ORDER — AMPHETAMINE-DEXTROAMPHET ER 30 MG PO CP24
30.0000 mg | ORAL_CAPSULE | Freq: Every morning | ORAL | 0 refills | Status: DC
  Filled 2023-12-29: qty 30, 30d supply, fill #0

## 2023-12-29 MED ORDER — AMLODIPINE BESYLATE 5 MG PO TABS
5.0000 mg | ORAL_TABLET | Freq: Every day | ORAL | 0 refills | Status: DC
  Filled 2023-12-29: qty 30, 30d supply, fill #0

## 2024-02-02 ENCOUNTER — Other Ambulatory Visit (HOSPITAL_BASED_OUTPATIENT_CLINIC_OR_DEPARTMENT_OTHER): Payer: Self-pay

## 2024-02-02 DIAGNOSIS — F900 Attention-deficit hyperactivity disorder, predominantly inattentive type: Secondary | ICD-10-CM | POA: Diagnosis not present

## 2024-02-02 DIAGNOSIS — Z6835 Body mass index (BMI) 35.0-35.9, adult: Secondary | ICD-10-CM | POA: Diagnosis not present

## 2024-02-02 DIAGNOSIS — I10 Essential (primary) hypertension: Secondary | ICD-10-CM | POA: Diagnosis not present

## 2024-02-02 DIAGNOSIS — I517 Cardiomegaly: Secondary | ICD-10-CM | POA: Diagnosis not present

## 2024-02-02 MED ORDER — AMPHETAMINE-DEXTROAMPHETAMINE 30 MG PO TABS
30.0000 mg | ORAL_TABLET | Freq: Two times a day (BID) | ORAL | 0 refills | Status: DC
Start: 2024-03-02 — End: 2024-09-23

## 2024-02-02 MED ORDER — AMLODIPINE BESYLATE 5 MG PO TABS
5.0000 mg | ORAL_TABLET | Freq: Every day | ORAL | 1 refills | Status: AC
  Filled 2024-02-02: qty 90, 90d supply, fill #0

## 2024-02-02 MED ORDER — AMPHETAMINE-DEXTROAMPHETAMINE 30 MG PO TABS
30.0000 mg | ORAL_TABLET | Freq: Two times a day (BID) | ORAL | 0 refills | Status: DC
  Filled 2024-02-02: qty 60, 30d supply, fill #0

## 2024-02-03 ENCOUNTER — Other Ambulatory Visit (HOSPITAL_BASED_OUTPATIENT_CLINIC_OR_DEPARTMENT_OTHER): Payer: Self-pay

## 2024-03-07 ENCOUNTER — Other Ambulatory Visit (HOSPITAL_BASED_OUTPATIENT_CLINIC_OR_DEPARTMENT_OTHER): Payer: Self-pay

## 2024-03-07 MED ORDER — AMLODIPINE BESYLATE 5 MG PO TABS
5.0000 mg | ORAL_TABLET | Freq: Every day | ORAL | 0 refills | Status: DC
  Filled 2024-03-07: qty 90, 90d supply, fill #0

## 2024-03-08 ENCOUNTER — Other Ambulatory Visit (HOSPITAL_BASED_OUTPATIENT_CLINIC_OR_DEPARTMENT_OTHER): Payer: Self-pay

## 2024-03-08 DIAGNOSIS — F900 Attention-deficit hyperactivity disorder, predominantly inattentive type: Secondary | ICD-10-CM | POA: Diagnosis not present

## 2024-03-08 DIAGNOSIS — I10 Essential (primary) hypertension: Secondary | ICD-10-CM | POA: Diagnosis not present

## 2024-03-08 DIAGNOSIS — K130 Diseases of lips: Secondary | ICD-10-CM | POA: Diagnosis not present

## 2024-03-08 MED ORDER — AMLODIPINE BESYLATE 5 MG PO TABS
5.0000 mg | ORAL_TABLET | Freq: Every day | ORAL | 0 refills | Status: DC
Start: 2024-03-08 — End: 2024-09-23
  Filled 2024-03-08: qty 90, 90d supply, fill #0

## 2024-03-08 MED ORDER — AMPHETAMINE-DEXTROAMPHETAMINE 30 MG PO TABS
30.0000 mg | ORAL_TABLET | Freq: Two times a day (BID) | ORAL | 0 refills | Status: DC
  Filled 2024-05-16: qty 60, 30d supply, fill #0

## 2024-03-08 MED ORDER — AMPHETAMINE-DEXTROAMPHETAMINE 30 MG PO TABS
30.0000 mg | ORAL_TABLET | Freq: Two times a day (BID) | ORAL | 0 refills | Status: DC
  Filled 2024-03-08: qty 60, 30d supply, fill #0

## 2024-03-08 MED ORDER — AMPHETAMINE-DEXTROAMPHETAMINE 30 MG PO TABS
30.0000 mg | ORAL_TABLET | Freq: Two times a day (BID) | ORAL | 0 refills | Status: DC
  Filled 2024-04-17: qty 60, 30d supply, fill #0

## 2024-03-08 MED ORDER — TRIAMCINOLONE ACETONIDE 0.1 % EX CREA
TOPICAL_CREAM | CUTANEOUS | 1 refills | Status: AC
Start: 2024-03-08 — End: ?
  Filled 2024-03-08: qty 30, 30d supply, fill #0

## 2024-03-11 ENCOUNTER — Other Ambulatory Visit: Payer: Self-pay

## 2024-03-12 ENCOUNTER — Other Ambulatory Visit (HOSPITAL_BASED_OUTPATIENT_CLINIC_OR_DEPARTMENT_OTHER): Payer: Self-pay

## 2024-04-17 ENCOUNTER — Other Ambulatory Visit (HOSPITAL_BASED_OUTPATIENT_CLINIC_OR_DEPARTMENT_OTHER): Payer: Self-pay

## 2024-05-16 ENCOUNTER — Other Ambulatory Visit (HOSPITAL_BASED_OUTPATIENT_CLINIC_OR_DEPARTMENT_OTHER): Payer: Self-pay

## 2024-05-23 ENCOUNTER — Other Ambulatory Visit (HOSPITAL_BASED_OUTPATIENT_CLINIC_OR_DEPARTMENT_OTHER): Payer: Self-pay

## 2024-06-26 ENCOUNTER — Other Ambulatory Visit (HOSPITAL_BASED_OUTPATIENT_CLINIC_OR_DEPARTMENT_OTHER): Payer: Self-pay

## 2024-06-26 MED ORDER — AMPHETAMINE-DEXTROAMPHETAMINE 30 MG PO TABS
30.0000 mg | ORAL_TABLET | Freq: Two times a day (BID) | ORAL | 0 refills | Status: DC
  Filled 2024-06-26 (×2): qty 60, 30d supply, fill #0

## 2024-07-27 ENCOUNTER — Other Ambulatory Visit: Payer: Self-pay

## 2024-07-27 ENCOUNTER — Emergency Department (HOSPITAL_BASED_OUTPATIENT_CLINIC_OR_DEPARTMENT_OTHER)
Admission: EM | Admit: 2024-07-27 | Discharge: 2024-07-27 | Disposition: A | Discharge status: Home / Self Care | Attending: Emergency Medicine | Admitting: Emergency Medicine

## 2024-07-27 ENCOUNTER — Encounter (HOSPITAL_BASED_OUTPATIENT_CLINIC_OR_DEPARTMENT_OTHER): Payer: Self-pay | Admitting: Emergency Medicine

## 2024-07-27 DIAGNOSIS — T1502XA Foreign body in cornea, left eye, initial encounter: Secondary | ICD-10-CM | POA: Diagnosis not present

## 2024-07-27 DIAGNOSIS — F17210 Nicotine dependence, cigarettes, uncomplicated: Secondary | ICD-10-CM | POA: Diagnosis not present

## 2024-07-27 DIAGNOSIS — X58XXXA Exposure to other specified factors, initial encounter: Secondary | ICD-10-CM | POA: Insufficient documentation

## 2024-07-27 DIAGNOSIS — T1592XA Foreign body on external eye, part unspecified, left eye, initial encounter: Secondary | ICD-10-CM | POA: Insufficient documentation

## 2024-07-27 MED ORDER — FLUORESCEIN SODIUM 1 MG OP STRP
1.0000 | ORAL_STRIP | Freq: Once | OPHTHALMIC | Status: AC
  Administered 2024-07-27: 1 via OPHTHALMIC
  Filled 2024-07-27: qty 1

## 2024-07-27 MED ORDER — TETRACAINE HCL 0.5 % OP SOLN
2.0000 [drp] | Freq: Once | OPHTHALMIC | Status: AC
  Administered 2024-07-27: 2 [drp] via OPHTHALMIC
  Filled 2024-07-27: qty 4

## 2024-07-27 NOTE — Discharge Instructions (Signed)
 Recommend remaining contact free over the next 24 to 48 hours.  Emergency room contacts after that.  Take Tylenol , ibuprofen for any pain.

## 2024-07-27 NOTE — ED Triage Notes (Signed)
 Pt reports inability to removed LT contact. Placed in eye last night

## 2024-07-27 NOTE — ED Notes (Signed)
 RN reviewed discharge instructions with pt. Pt verbalized understanding and had no further questions

## 2024-07-27 NOTE — ED Provider Notes (Signed)
 Toni Dougherty   CSN: [id number] Arrival date & time: 07/27/24  1742     Patient presents with: Foreign Body in [facility] Toni Dougherty is a 53 y.o. female.    Foreign Body in Eye   53 year old female presents to the emergency department concern for contact stuck in her left eye.  Contact was placed last night and Toni Dougherty slept on them and has not been able to get the left 1 out although Toni Dougherty did get the right 1 out.  Denies any visual disturbance.  States that while in the triage area, the nurses were able to get the contact out in her symptoms have improved.  Past medical history significant for narcolepsy, ADD  Prior to Admission medications   Medication Sig Start Date End Date Taking? Authorizing Provider  amLODipine  (NORVASC ) 5 MG tablet Take 1 tablet (5 mg total) by mouth daily. 02/02/24     amLODipine  (NORVASC ) 5 MG tablet Take 1 tablet (5 mg total) by mouth daily. 03/07/24     amLODipine  (NORVASC ) 5 MG tablet Take 1 tablet (5 mg total) by mouth daily. 03/08/24     amphetamine -dextroamphetamine  (ADDERALL XR) 20 MG 24 hr capsule Take 1 capsule (20 mg total) by mouth every morning. 12/13/23   Webb, Padonda B, FNP  amphetamine -dextroamphetamine  (ADDERALL XR) 30 MG 24 hr capsule Take 1 capsule (30 mg total) by mouth every morning. 12/29/23     amphetamine -dextroamphetamine  (ADDERALL) 10 MG tablet Take 1 tablet (10 mg total) by mouth daily as needed. 12/29/23     amphetamine -dextroamphetamine  (ADDERALL) 30 MG tablet Take 1 tablet by mouth 2 (two) times daily. 02/02/24     amphetamine -dextroamphetamine  (ADDERALL) 30 MG tablet Take 1 tablet by mouth 2 (two) times daily. 03/02/24     amphetamine -dextroamphetamine  (ADDERALL) 30 MG tablet Take 1 tablet by mouth 2 (two) times daily. 05/04/24     amphetamine -dextroamphetamine  (ADDERALL) 30 MG tablet Take 1 tablet by mouth 2 (two) times daily. 04/06/24     amphetamine -dextroamphetamine  (ADDERALL) 30 MG  tablet Take 1 tablet by mouth 2 (two) times daily. 03/08/24     amphetamine -dextroamphetamine  (ADDERALL) 30 MG tablet Take 1 tablet by mouth 2 (two) times daily. 06/25/24     nebivolol  (BYSTOLIC ) 5 MG tablet Take 1 tablet (5 mg total) by mouth daily. 11/16/23   Joshua Debby CROME, MD  potassium chloride  (KLOR-CON  10) 10 MEQ tablet Take 1 tablet (10 mEq total) by mouth 2 (two) times daily. 11/16/23   Joshua Debby CROME, MD  rosuvastatin  (CRESTOR ) 20 MG tablet Take 1 tablet (20 mg total) by mouth daily. 07/03/23   Joshua Debby CROME, MD  torsemide  (DEMADEX ) 20 MG tablet Take 1 tablet (20 mg total) by mouth 2 (two) times daily. 11/16/23   Joshua Debby CROME, MD  triamcinolone  cream (KENALOG ) 0.1 % Apply to affected area(s) twice daily as needed 03/08/24       Allergies: Erythromycin and Penicillins    Review of Systems  All other systems reviewed and are negative.   Updated Vital Signs BP (!) 171/106 (BP Location: Right Arm)   Pulse 78   Temp 98.2 F (36.8 C) (Oral)   Resp 17   LMP 07/17/2024   SpO2 96%   Physical Exam Vitals and nursing Dougherty reviewed.  Constitutional:      General: Toni Dougherty is not in acute distress.    Appearance: Toni Dougherty is well-developed.  HENT:     Head: Normocephalic and atraumatic.  Eyes:  General: Lids are normal. Lids are everted, no foreign bodies appreciated. Vision grossly intact. Gaze aligned appropriately.     Extraocular Movements:     Right eye: Normal extraocular motion and no nystagmus.     Left eye: Normal extraocular motion and no nystagmus.     Conjunctiva/sclera: Conjunctivae normal.     Comments: Conjunctival injection.  Fluorescein  exam showed no uptake.  No obvious abrasion.  No contact present.  Seidel sign negative.  Cardiovascular:     Rate and Rhythm: Normal rate and regular rhythm.     Heart sounds: No murmur heard. Pulmonary:     Effort: Pulmonary effort is normal. No respiratory distress.     Breath sounds: Normal breath sounds.  Abdominal:      Palpations: Abdomen is soft.     Tenderness: There is no abdominal tenderness.  Musculoskeletal:        General: No swelling.     Cervical back: Neck supple.  Skin:    General: Skin is warm and dry.     Capillary Refill: Capillary refill takes less than 2 seconds.  Neurological:     Mental Status: Toni Dougherty is alert.  Psychiatric:        Mood and Affect: Mood normal.     (all labs ordered are listed, but only abnormal results are displayed) Labs Reviewed - No data to display  EKG: None  Radiology: No results found.   Procedures   Medications Ordered in the ED  tetracaine  (PONTOCAINE) 0.5 % ophthalmic solution 2 drop (2 drops Both Eyes Given 07/27/24 1934)  fluorescein  ophthalmic strip 1 strip (1 strip Both Eyes Given 07/27/24 1934)                                    Medical Decision Making Risk Prescription drug management.   This patient presents to the ED for concern of contact lens in the eye, this involves an extensive number of treatment options, and is a complaint that carries with it a high risk of complications and morbidity.  The differential diagnosis includes foreign body in eye, corneal abrasion, corneal ulceration, global penetration/perforation, other   Co morbidities that complicate the patient evaluation  See HPI   Additional history obtained:  Additional history obtained from EMR External records from outside source obtained and reviewed including hospital records   Lab Tests:  N/a   Imaging Studies ordered:  N/a   Cardiac Monitoring: / EKG:  N/a   Consultations Obtained:  N/a   Problem List / ED Course / Critical interventions / Medication management  Foreign body in left eye I ordered medication including tetracaine , fluorescein    Reevaluation of the patient after these medicines showed that the patient improved I have reviewed the patients home medicines and have made adjustments as needed   Social Determinants of  Health:  Chronic cigarette use.  Denies illicit drug use.   Test / Admission - Considered:  Foreign body in left eye Vitals signs significant for hypertension.  Patient not take her at home hypertensive medications.  Will recommend taking these blood pressure medications.. Otherwise within normal range and stable throughout visit. 53 year old female presents to the emergency department concern for contact stuck in her left eye.  Contact was placed last night and Toni Dougherty slept on them and has not been able to get the left 1 out although Toni Dougherty did get the right 1 out.  Denies any  visual disturbance.  States that while in the triage area, the nurses were able to get the contact out in her symptoms have improved. On exam, left-sided corneal injection appreciated.  No obvious foreign body present.  Patient reported resolution of symptoms when the nursing staff was able to remove the contact lens.  Visual acuity at baseline when not having contact in.  Further workup deemed necessary at this time.  Would recommend contact abstinence over the next 24 to 36 hours or so and follow-up with eye specialist in the outpatient setting.  Treatment plan discussed with patient Toni Dougherty is understanding was agreeable.  Patient well-appearing, afebrile in no acute distress upon discharge. Worrisome signs and symptoms were discussed with the patient, and the patient acknowledged understanding to return to the ED if noticed. Patient was stable upon discharge.       Final diagnoses:  None    ED Discharge Orders     None          Silver Wonda LABOR, [name] 07/27/24 1956    Yolande Lamar BROCKS, MD 07/28/24 [phone]

## 2024-07-31 ENCOUNTER — Other Ambulatory Visit (HOSPITAL_BASED_OUTPATIENT_CLINIC_OR_DEPARTMENT_OTHER): Payer: Self-pay

## 2024-07-31 MED ORDER — AMPHETAMINE-DEXTROAMPHETAMINE 30 MG PO TABS
ORAL_TABLET | ORAL | 0 refills | Status: DC
  Filled 2024-07-31: qty 60, 30d supply, fill #0

## 2024-08-01 ENCOUNTER — Other Ambulatory Visit (HOSPITAL_BASED_OUTPATIENT_CLINIC_OR_DEPARTMENT_OTHER): Payer: Self-pay

## 2024-09-04 ENCOUNTER — Other Ambulatory Visit (HOSPITAL_BASED_OUTPATIENT_CLINIC_OR_DEPARTMENT_OTHER): Payer: Self-pay

## 2024-09-04 MED ORDER — AMLODIPINE BESYLATE 5 MG PO TABS
5.0000 mg | ORAL_TABLET | Freq: Every day | ORAL | 0 refills | Status: DC
  Filled 2024-09-04: qty 90, 90d supply, fill #0

## 2024-09-04 MED ORDER — AMPHETAMINE-DEXTROAMPHETAMINE 30 MG PO TABS
30.0000 mg | ORAL_TABLET | Freq: Two times a day (BID) | ORAL | 0 refills | Status: DC
  Filled 2024-09-04: qty 60, 30d supply, fill #0

## 2024-09-05 ENCOUNTER — Other Ambulatory Visit (HOSPITAL_BASED_OUTPATIENT_CLINIC_OR_DEPARTMENT_OTHER): Payer: Self-pay

## 2024-09-05 ENCOUNTER — Other Ambulatory Visit (HOSPITAL_COMMUNITY): Payer: Self-pay | Admitting: Physician Assistant

## 2024-09-05 DIAGNOSIS — R1310 Dysphagia, unspecified: Secondary | ICD-10-CM | POA: Diagnosis not present

## 2024-09-05 DIAGNOSIS — R1319 Other dysphagia: Secondary | ICD-10-CM

## 2024-09-05 DIAGNOSIS — Z6829 Body mass index (BMI) 29.0-29.9, adult: Secondary | ICD-10-CM | POA: Diagnosis not present

## 2024-09-05 DIAGNOSIS — I10 Essential (primary) hypertension: Secondary | ICD-10-CM | POA: Diagnosis not present

## 2024-09-05 DIAGNOSIS — F900 Attention-deficit hyperactivity disorder, predominantly inattentive type: Secondary | ICD-10-CM | POA: Diagnosis not present

## 2024-09-05 MED ORDER — AMPHETAMINE-DEXTROAMPHETAMINE 30 MG PO TABS
30.0000 mg | ORAL_TABLET | Freq: Two times a day (BID) | ORAL | 0 refills | Status: DC
  Filled ????-??-??: fill #0

## 2024-09-05 MED ORDER — AMPHETAMINE-DEXTROAMPHETAMINE 30 MG PO TABS
30.0000 mg | ORAL_TABLET | Freq: Two times a day (BID) | ORAL | 0 refills | Status: DC
  Filled 2024-10-03 – 2024-10-15 (×2): qty 60, 30d supply, fill #0

## 2024-09-09 ENCOUNTER — Ambulatory Visit (HOSPITAL_COMMUNITY): Admission: RE | Admit: 2024-09-09 | Source: Ambulatory Visit

## 2024-09-09 ENCOUNTER — Encounter (HOSPITAL_COMMUNITY): Payer: Self-pay

## 2024-09-10 ENCOUNTER — Other Ambulatory Visit (HOSPITAL_BASED_OUTPATIENT_CLINIC_OR_DEPARTMENT_OTHER): Payer: Self-pay

## 2024-09-10 DIAGNOSIS — R1310 Dysphagia, unspecified: Secondary | ICD-10-CM | POA: Diagnosis not present

## 2024-09-10 DIAGNOSIS — K219 Gastro-esophageal reflux disease without esophagitis: Secondary | ICD-10-CM | POA: Diagnosis not present

## 2024-09-10 MED ORDER — SUCRALFATE 1 G PO TABS
1.0000 g | ORAL_TABLET | Freq: Two times a day (BID) | ORAL | 1 refills | Status: AC
  Filled 2024-09-10: qty 60, 30d supply, fill #0

## 2024-09-12 ENCOUNTER — Other Ambulatory Visit (HOSPITAL_BASED_OUTPATIENT_CLINIC_OR_DEPARTMENT_OTHER): Payer: Self-pay

## 2024-09-18 DIAGNOSIS — R634 Abnormal weight loss: Secondary | ICD-10-CM | POA: Diagnosis not present

## 2024-09-18 DIAGNOSIS — C159 Malignant neoplasm of esophagus, unspecified: Secondary | ICD-10-CM | POA: Diagnosis not present

## 2024-09-18 DIAGNOSIS — K2970 Gastritis, unspecified, without bleeding: Secondary | ICD-10-CM | POA: Diagnosis not present

## 2024-09-18 DIAGNOSIS — R1310 Dysphagia, unspecified: Secondary | ICD-10-CM | POA: Diagnosis not present

## 2024-09-18 DIAGNOSIS — D490 Neoplasm of unspecified behavior of digestive system: Secondary | ICD-10-CM | POA: Diagnosis not present

## 2024-09-18 DIAGNOSIS — K3189 Other diseases of stomach and duodenum: Secondary | ICD-10-CM | POA: Diagnosis not present

## 2024-09-20 ENCOUNTER — Other Ambulatory Visit (HOSPITAL_BASED_OUTPATIENT_CLINIC_OR_DEPARTMENT_OTHER): Payer: Self-pay

## 2024-09-20 ENCOUNTER — Encounter: Payer: Self-pay | Admitting: Internal Medicine

## 2024-09-23 ENCOUNTER — Encounter: Payer: Self-pay | Admitting: Hematology

## 2024-09-23 ENCOUNTER — Inpatient Hospital Stay: Attending: Hematology | Admitting: Hematology

## 2024-09-23 ENCOUNTER — Inpatient Hospital Stay

## 2024-09-23 ENCOUNTER — Inpatient Hospital Stay: Admitting: Licensed Clinical Social Worker

## 2024-09-23 ENCOUNTER — Other Ambulatory Visit (HOSPITAL_BASED_OUTPATIENT_CLINIC_OR_DEPARTMENT_OTHER): Payer: Self-pay

## 2024-09-23 VITALS — BP 141/103 | HR 87 | Temp 97.9°F | Resp 20 | Ht 72.0 in | Wt 193.7 lb

## 2024-09-23 DIAGNOSIS — D649 Anemia, unspecified: Secondary | ICD-10-CM | POA: Diagnosis not present

## 2024-09-23 DIAGNOSIS — F909 Attention-deficit hyperactivity disorder, unspecified type: Secondary | ICD-10-CM | POA: Diagnosis not present

## 2024-09-23 DIAGNOSIS — C155 Malignant neoplasm of lower third of esophagus: Secondary | ICD-10-CM

## 2024-09-23 DIAGNOSIS — I10 Essential (primary) hypertension: Secondary | ICD-10-CM | POA: Diagnosis not present

## 2024-09-23 DIAGNOSIS — K222 Esophageal obstruction: Secondary | ICD-10-CM | POA: Diagnosis not present

## 2024-09-23 DIAGNOSIS — K5900 Constipation, unspecified: Secondary | ICD-10-CM | POA: Diagnosis not present

## 2024-09-23 DIAGNOSIS — Z79899 Other long term (current) drug therapy: Secondary | ICD-10-CM | POA: Diagnosis not present

## 2024-09-23 DIAGNOSIS — F17210 Nicotine dependence, cigarettes, uncomplicated: Secondary | ICD-10-CM | POA: Diagnosis not present

## 2024-09-23 DIAGNOSIS — Z7952 Long term (current) use of systemic steroids: Secondary | ICD-10-CM | POA: Diagnosis not present

## 2024-09-23 MED ORDER — NICOTINE 7 MG/24HR TD PT24
7.0000 mg | MEDICATED_PATCH | Freq: Every day | TRANSDERMAL | 0 refills | Status: DC
  Filled 2024-09-23: qty 28, 28d supply, fill #0

## 2024-09-23 NOTE — Progress Notes (Addendum)
 PATIENT NAVIGATOR PROGRESS NOTE  Name: Toni Dougherty Date: 09/23/2024 MRN: [id number]  DOB: 26-Nov-1970   Reason for visit:  Initial Appt with Dr. Onita Mattock  Comments:   Patient was seen during her initial appt with Dr. Mattock.  Patient was accompanied by 2 friends. SDOH assessment was completed and patient was able to meet with Social Work during her initial visit. Patient was given a Journey's binder with information specific to her diagnosis.  Patient was also given my direct contact information and was instructed to contact the office with any questions or concerns after today.  Patient verbalized understanding.   Urgent referral placed for Nutrition consult due to patients recent weight loss.  Patient has been able tolerate liquid nutrition.    Time spent counseling/coordinating care: > 60 minutes

## 2024-09-23 NOTE — Progress Notes (Signed)
 [facility]   [phone] [phone]   Clinic New Consult Note   Patient Care Team: Pcp, No as PCP - General Ardis Evalene CROME, RN as Oncology Nurse Navigator 09/23/2024  CHIEF COMPLAINTS/PURPOSE OF CONSULTATION:  Newly diagnosed distal esophageal adenocarcinoma  REFERRING PHYSICIAN: GI Dr. Kriss   Discussed the use of AI scribe software for clinical note transcription with the patient, who gave verbal consent to proceed.  History of Present Illness Toni Dougherty is a 53 year old female with newly diagnosed esophageal cancer who presents for a new consult. She was referred by her GI doctor for evaluation of her esophageal cancer.  She is accompanied by 2 female friends to the clinic today.  She experiences significant dysphagia, which has worsened over the past two to three months. Initially, she had difficulty with solid foods, and now even liquids sometimes get stuck, causing regurgitation of a foamy substance. This has resulted in a weight loss of approximately 47-50 pounds since January, with her current weight at 193 pounds. Liquids sometimes cause discomfort and a burning sensation when swallowed, but she does not experience pain. She manages her nutrition with small amounts of protein shakes and cold liquid nutrition, such as Ensure.  She feels a sensation of tightness and discomfort in her stomach area, particularly when sitting or lying down, but denies bloating or significant abdominal pain. She experiences constipation but has not been taking any medication for it.  She was referred to GI Dr. Kriss and underwent EGD on September 18, 2024, which showed a medium size fungating mass with bleeding in the distal esophagus, 36 to 40 cm from the incisors.  The mass was partially obstructing and partially circumferential.  Biopsy showed poorly differentiated adenocarcinoma.  Her past medical history includes hypertension and ADHD. She takes  amlodipine  and a cholesterol medication irregularly. She has a history of smoking and occasional alcohol consumption. She has undergone appendectomy and rotator cuff surgery on the right side. She denies any family history of cancer.     MEDICAL HISTORY:  Past Medical History:  Diagnosis Date   ADD (attention deficit disorder)    HTN (hypertension)    Narcolepsy     SURGICAL HISTORY: Past Surgical History:  Procedure Laterality Date   APPENDECTOMY     rotate cuff Right 2012    SOCIAL HISTORY: Social History   Socioeconomic History   Marital status: Single    Spouse name: Not on file   Number of children: 2   Years of education: Not on file   Highest education level: Bachelor's degree (e.g., BA, AB, BS)  Occupational History   Not on file  Tobacco Use   Smoking status: Every Day    Current packs/day: 0.50    Average packs/day: 0.5 packs/day for 25.0 years (12.5 ttl pk-yrs)    Types: Cigarettes   Smokeless tobacco: Never  Vaping Use   Vaping status: Never Used  Substance and Sexual Activity   Alcohol use: Yes    Alcohol/week: 10.0 standard drinks of alcohol    Types: 10 Shots of liquor per week   Drug use: Not Currently   Sexual activity: Yes    Partners: Male    Birth control/protection: I.U.D., Condom  Other Topics Concern   Not on file  Social History Narrative   Not on file   Social Drivers of Health   Financial Resource Strain: Patient Declined (11/16/2023)   Overall Financial Resource Strain (CARDIA)    Difficulty of Paying  Living Expenses: Patient declined  Food Insecurity: Food Insecurity Present (09/21/2024)   Hunger Vital Sign    Worried About Running Out of Food in the Last Year: Often true    Ran Out of Food in the Last Year: Not on file  Transportation Needs: Unmet Transportation Needs (09/21/2024)   PRAPARE - Administrator, Civil Service (Medical): Yes    Lack of Transportation (Non-Medical): Yes  Physical Activity: Insufficiently  Active (11/16/2023)   Exercise Vital Sign    Days of Exercise per Week: 1 day    Minutes of Exercise per Session: 30 min  Stress: Patient Declined (11/16/2023)   [employer] of Occupational Health - Occupational Stress Questionnaire    Feeling of Stress : Patient declined  Social Connections: Unknown (11/16/2023)   Social Connection and Isolation Panel    Frequency of Communication with Friends and Family: More than three times a week    Frequency of Social Gatherings with Friends and Family: Patient declined    Attends Religious Services: Patient declined    Database Administrator or Organizations: Yes    Attends Engineer, Structural: More than 4 times per year    Marital Status: Divorced  Catering Manager Violence: Not on file    FAMILY HISTORY: Family History  Problem Relation Age of Onset   Hypertension Father    Stroke Father    Alcoholism Father    Alcoholism Sister    Alcoholism Sister    Alcoholism Sister    Alcoholism Sister    Alcoholism Brother    Alcoholism Brother    Alcoholism Brother     ALLERGIES:  is allergic to erythromycin and penicillins.  MEDICATIONS:  Current Outpatient Medications  Medication Sig Dispense Refill   amLODipine  (NORVASC ) 5 MG tablet Take 1 tablet (5 mg total) by mouth daily. 90 tablet 1   amphetamine -dextroamphetamine  (ADDERALL) 30 MG tablet Take 1 tablet by mouth 2 (two) times daily. 60 tablet 0   nebivolol  (BYSTOLIC ) 5 MG tablet Take 1 tablet (5 mg total) by mouth daily. 30 tablet 0   [START ON 10/21/2024] nicotine (NICODERM CQ - DOSED IN MG/24 HR) 7 mg/24hr patch Place 1 patch (7 mg total) onto the skin daily. 28 patch 0   rosuvastatin  (CRESTOR ) 20 MG tablet Take 1 tablet (20 mg total) by mouth daily. 90 tablet 1   sucralfate (CARAFATE) 1 g tablet Take 1 tablet (1 g total) by mouth 2 (two) times daily on an empty stomach. 60 tablet 1   triamcinolone  cream (KENALOG ) 0.1 % Apply to affected area(s) twice daily as needed 30 g 1    No current facility-administered medications for this visit.    REVIEW OF SYSTEMS:   Constitutional: Denies fevers, chills or abnormal night sweats Eyes: Denies blurriness of vision, double vision or watery eyes Ears, nose, mouth, throat, and face: Denies mucositis or sore throat Respiratory: Denies cough, dyspnea or wheezes Cardiovascular: Denies palpitation, chest discomfort or lower extremity swelling Gastrointestinal:  Denies nausea, heartburn or change in bowel habits Skin: Denies abnormal skin rashes Lymphatics: Denies new lymphadenopathy or easy bruising Neurological:Denies numbness, tingling or new weaknesses Behavioral/Psych: Mood is stable, no new changes  All other systems were reviewed with the patient and are negative.  PHYSICAL EXAMINATION: ECOG PERFORMANCE STATUS: 1 - Symptomatic but completely ambulatory  Vitals:   09/23/24 1510 09/23/24 1522  BP: (!) 145/110 (!) 141/103  Pulse: 87   Resp: 20 20  Temp: 97.9 F (36.6 C)  SpO2: 98%    Filed Weights   09/23/24 1510  Weight: 193 lb 11.2 oz (87.9 kg)    GENERAL:alert, no distress and comfortable SKIN: skin color, texture, turgor are normal, no rashes or significant lesions EYES: normal, conjunctiva are pink and non-injected, sclera clear OROPHARYNX:no exudate, no erythema and lips, buccal mucosa, and tongue normal  NECK: supple, thyroid normal size, non-tender, without nodularity LYMPH:  no palpable lymphadenopathy in the cervical, axillary or inguinal LUNGS: clear to auscultation and percussion with normal breathing effort HEART: regular rate & rhythm and no murmurs and no lower extremity edema ABDOMEN:abdomen soft, non-tender and normal bowel sounds Musculoskeletal:no cyanosis of digits and no clubbing  PSYCH: alert & oriented x 3 with fluent speech NEURO: no focal motor/sensory deficits  Physical Exam VITALS: BP- 141/103 MEASUREMENTS: Weight- 193. ABDOMEN: Mild tenderness in the stomach area on  palpation.  LABORATORY DATA:  I have reviewed the data as listed    Latest Ref Rng & Units 06/28/2023    4:18 PM 03/21/2023   11:04 AM 09/23/2022   10:00 AM  CBC  WBC 4.0 - 10.5 K/uL 9.2  11.1  9.6   Hemoglobin 12.0 - 15.0 g/dL 84.7  85.8  85.0   Hematocrit 36.0 - 46.0 % 46.6  42.1  45.4   Platelets 150.0 - 400.0 K/uL 294.0  280  245.0       Latest Ref Rng & Units 06/28/2023    4:18 PM 03/21/2023   11:04 AM 09/23/2022   10:00 AM  CMP  Glucose 70 - 99 mg/dL 96  95  898   BUN 6 - 23 mg/dL 15  17  11    Creatinine 0.40 - 1.20 mg/dL 8.67  8.31  8.87   Sodium 135 - 145 mEq/L 134  135  133   Potassium 3.5 - 5.1 mEq/L 3.7  3.1  3.7   Chloride 96 - 112 mEq/L 102  100  103   CO2 19 - 32 mEq/L 25  24  25    Calcium  8.4 - 10.5 mg/dL 9.4  8.7  8.5   Total Protein 6.5 - 8.1 g/dL  7.6  7.0   Total Bilirubin 0.3 - 1.2 mg/dL  0.6  0.5   Alkaline Phos 38 - 126 U/L  64  64   AST 15 - 41 U/L  24  16   ALT 0 - 44 U/L  19  13      RADIOGRAPHIC STUDIES: I have personally reviewed the radiological images as listed and agreed with the findings in the report. No results found.   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 Symptoms include difficulty swallowing both solids and liquids, with partial obstruction and burning sensation in the esophagus. Biopsy confirmed poorly differentiated adenocarcinoma, indicating an aggressive form of cancer. Likely locally advanced due to significant symptoms and weight loss.  - I recommend staging PET scan and endoscopy ultrasound to determine the extent of disease.  - If no distant metastasis and tumor is T2 or above, or positive node on PET scan, then I recommended neoadjuvant chemotherapy regimen (FLOT) for 4 months.  I briefly discussed the  benefit of chemotherapy and potential side effect.  Surgery involves partial esophagectomy with potential need for a feeding tube post-operatively. Goal is cure if cancer is localized, but treatment is challenging due  to aggressive nature of adenocarcinoma. - Referred to surgeon Dr. Shyrl for surgical evaluation. - Will schedule port placement for chemotherapy administration. - Plan for chemotherapy regimen (FLOT) every two weeks. - Will discuss potential need for feeding tube if swallowing worsens. - Referred to dietitian for nutritional support. - Encouraged liquid nutrition intake to maintain weight. - I have requested molecular testing including MMR, PD-L1 and HER2 test on her biopsy, to see if she is a candidate for immunotherapy and or targeted therapy.  Constipation Reports constipation without current use of laxatives. No significant abdominal pain reported. - Recommended Miralax for constipation management. - Discussed use of suppositories if oral laxatives are not preferred.  Essential hypertension Hypertension managed with medication. Blood pressure today was 141/103 mmHg, possibly elevated due to anxiety. No home monitoring currently in place. - Advised home blood pressure monitoring to assess true blood pressure levels.  Nicotine dependence, cigarettes Long-term smoker with current use of approximately half a pack per day. Previous attempt to quit using expired nicotine patches. Discussed risks of nicotine patches and alternative cessation methods. - Prescribed nicotine patches for smoking cessation. - Discussed potential use of smoking cessation counseling, though she declined.  Plan - I reviewed her EGD report and biopsy results and discussed with patient in detail. - PET scan and EUS for staging - Referred to thoracic surgeon Dr. Shyrl - Follow-up after PET scan and EUS, to finalize her neoadjuvant therapy. - Urgent dietitian consult - Patient was seen by our  social worker today and I encouraged her to follow-up for anxiety counseling   Orders Placed This Encounter  Procedures   NM PET Image Initial (PI) Skull Base To Thigh    Standing Status:   Future    Expected Date:   09/26/2024    Expiration Date:   09/23/2025    If indicated for the ordered procedure, I authorize the administration of a radiopharmaceutical per Radiology protocol:   Yes    Is the patient pregnant?:   No    Preferred imaging location?:   [facility]   CBC with Differential/Platelet    Standing Status:   Standing    Number of Occurrences:   50    Expiration Date:   09/23/2025   Comprehensive metabolic panel with GFR    Standing Status:   Standing    Number of Occurrences:   50    Expiration Date:   09/23/2025   CEA (Access)    Standing Status:   Standing    Number of Occurrences:   30    Expiration Date:   09/23/2025   Ferritin    Standing Status:   Standing    Number of Occurrences:   20    Expiration Date:   09/23/2025   Vitamin B12    Standing Status:   Future    Expected Date:   09/23/2024    Expiration Date:   09/23/2025   Folate RBC    Standing Status:   Future    Expiration Date:   09/23/2025    All questions were answered. The patient knows to call the clinic with any problems, questions or concerns. I spent 50 minutes counseling the patient face to face. The total time spent in the appointment was 60 minutes including review of chart and various tests results, discussions about plan of care and coordination of care plan.     Onita Mattock, MD 09/23/2024 5:10 PM  '

## 2024-09-23 NOTE — Progress Notes (Signed)
 [facility] Clinical Social Work  Initial Assessment   Toni Dougherty is a 53 y.o. year old female accompanied by patient and 2 friends, 1 of which is pt's roommate. Clinical Social Work was referred by medical provider for assessment of psychosocial needs.   SDOH (Social Determinants of Health) assessments performed: Yes SDOH Interventions    Flowsheet Row Office Visit from 09/23/2024 in [facility] [place]. Toni Dougherty  SDOH Interventions   Depression Interventions/Treatment  Referral to Psychiatry    SDOH Screenings   Food Insecurity: Food Insecurity Present (09/21/2024)  Housing: High Risk (09/21/2024)  Transportation Needs: Unmet Transportation Needs (09/21/2024)  Utilities: Not At Risk (09/21/2024)  Alcohol Screen: Low Risk  (11/16/2023)  Depression (PHQ2-9): High Risk (09/23/2024)  Financial Resource Strain: Patient Declined (11/16/2023)  Physical Activity: Insufficiently Active (11/16/2023)  Social Connections: Unknown (11/16/2023)  Stress: Patient Declined (11/16/2023)  Tobacco Use: High Risk (09/23/2024)    PHQ 2/9:    09/23/2024    3:10 PM 11/16/2023   11:14 AM 06/28/2023    3:48 PM  Depression screen PHQ 2/9  Decreased Interest 3 0 0  Down, Depressed, Hopeless 3 0 0  PHQ - 2 Score 6 0 0  Altered sleeping 0  0  Tired, decreased energy 3  0  Change in appetite 0  0  Feeling bad or failure about yourself  3  0  Trouble concentrating 3  0  Moving slowly or fidgety/restless 0  0  Suicidal thoughts 3  0  PHQ-9 Score 18  0      Data saved with a previous flowsheet row definition     Distress Screen completed: No     No data to display            Family/Social Information:  Housing Arrangement: patient lives with a room mate  Family members/support persons in your life? Pt is originally from [place].  Pt's family is all in [place].  Support is limited to friends who reside locally.  Pt's room mate states she and other friends will provide any  support pt needs. 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  Income source: Educational Psychologist concerns: Yes, current concerns Type of concern: Advertising Account Planner access concerns: no Religious or spiritual practice: No Advanced directives: no, pt informed of advanced directives clinics available  Services Currently in place:  none  Coping/ Adjustment to diagnosis: Patient understands treatment plan and what happens next? yes Concerns about diagnosis and/or treatment: Losing my job and/or losing income, Overwhelmed by information, Afraid of cancer, How I will pay for the services I need, How will I care for myself, and Quality of life Patient reported stressors: Finances, Depression, Anxiety/ nervousness, and Adjusting to my illness Hopes and/or priorities: pt's priority is to complete diagnostics and start treatment w/ the hope of positive results Patient enjoys not addressed Current coping skills/ strengths: Capable of independent living , Motivation for treatment/growth , Physical Health , and Supportive family/friends     SUMMARY: Current SDOH Barriers:  Financial constraints related to limited income and potential loss of income during treatment and Limited social support  Clinical Social Work Clinical Goal(s):  Explore community resource options for unmet needs related to:  Financial Strain   Interventions: Discussed common feeling and emotions when being diagnosed with cancer, and the importance of support during treatment Informed patient of the support team roles and support  services at [facility] Provided CSW contact information and encouraged patient to call with any questions or concerns Discussed the Mable Ferretti and qualifications w/ pt.  Informed of additional community grants that may be available once treatment begins.  Pt aware of availability of support group as well as individual counseling if  appropriate.    Follow Up Plan: Patient will contact CSW with any support or resource needs Patient verbalizes understanding of plan: Yes    Toni Dougherty Toni Manna, LCSW Clinical Social Worker [facility]

## 2024-09-23 NOTE — Addendum Note (Signed)
 Addended by: ARDIS EVALENE CROME on: 09/23/2024 04:55 PM   Modules accepted: Orders

## 2024-09-23 NOTE — H&P (View-Only) (Signed)
 [facility]   [phone] [phone]   Clinic New Consult Note   Patient Care Team: Pcp, No as PCP - General Ardis Evalene CROME, RN as Oncology Nurse Navigator 09/23/2024  CHIEF COMPLAINTS/PURPOSE OF CONSULTATION:  Newly diagnosed distal esophageal adenocarcinoma  REFERRING PHYSICIAN: GI Dr. Kriss   Discussed the use of AI scribe software for clinical note transcription with the patient, who gave verbal consent to proceed.  History of Present Illness Toni Dougherty is a 53 year old female with newly diagnosed esophageal cancer who presents for a new consult. She was referred by her GI doctor for evaluation of her esophageal cancer.  She is accompanied by 2 female friends to the clinic today.  She experiences significant dysphagia, which has worsened over the past two to three months. Initially, she had difficulty with solid foods, and now even liquids sometimes get stuck, causing regurgitation of a foamy substance. This has resulted in a weight loss of approximately 47-50 pounds since January, with her current weight at 193 pounds. Liquids sometimes cause discomfort and a burning sensation when swallowed, but she does not experience pain. She manages her nutrition with small amounts of protein shakes and cold liquid nutrition, such as Ensure.  She feels a sensation of tightness and discomfort in her stomach area, particularly when sitting or lying down, but denies bloating or significant abdominal pain. She experiences constipation but has not been taking any medication for it.  She was referred to GI Dr. Kriss and underwent EGD on September 18, 2024, which showed a medium size fungating mass with bleeding in the distal esophagus, 36 to 40 cm from the incisors.  The mass was partially obstructing and partially circumferential.  Biopsy showed poorly differentiated adenocarcinoma.  Her past medical history includes hypertension and ADHD. She takes  amlodipine  and a cholesterol medication irregularly. She has a history of smoking and occasional alcohol consumption. She has undergone appendectomy and rotator cuff surgery on the right side. She denies any family history of cancer.     MEDICAL HISTORY:  Past Medical History:  Diagnosis Date   ADD (attention deficit disorder)    HTN (hypertension)    Narcolepsy     SURGICAL HISTORY: Past Surgical History:  Procedure Laterality Date   APPENDECTOMY     rotate cuff Right 2012    SOCIAL HISTORY: Social History   Socioeconomic History   Marital status: Single    Spouse name: Not on file   Number of children: 2   Years of education: Not on file   Highest education level: Bachelor's degree (e.g., BA, AB, BS)  Occupational History   Not on file  Tobacco Use   Smoking status: Every Day    Current packs/day: 0.50    Average packs/day: 0.5 packs/day for 25.0 years (12.5 ttl pk-yrs)    Types: Cigarettes   Smokeless tobacco: Never  Vaping Use   Vaping status: Never Used  Substance and Sexual Activity   Alcohol use: Yes    Alcohol/week: 10.0 standard drinks of alcohol    Types: 10 Shots of liquor per week   Drug use: Not Currently   Sexual activity: Yes    Partners: Male    Birth control/protection: I.U.D., Condom  Other Topics Concern   Not on file  Social History Narrative   Not on file   Social Drivers of Health   Financial Resource Strain: Patient Declined (11/16/2023)   Overall Financial Resource Strain (CARDIA)    Difficulty of Paying  Living Expenses: Patient declined  Food Insecurity: Food Insecurity Present (09/21/2024)   Hunger Vital Sign    Worried About Running Out of Food in the Last Year: Often true    Ran Out of Food in the Last Year: Not on file  Transportation Needs: Unmet Transportation Needs (09/21/2024)   PRAPARE - Administrator, Civil Service (Medical): Yes    Lack of Transportation (Non-Medical): Yes  Physical Activity: Insufficiently  Active (11/16/2023)   Exercise Vital Sign    Days of Exercise per Week: 1 day    Minutes of Exercise per Session: 30 min  Stress: Patient Declined (11/16/2023)   [employer] of Occupational Health - Occupational Stress Questionnaire    Feeling of Stress : Patient declined  Social Connections: Unknown (11/16/2023)   Social Connection and Isolation Panel    Frequency of Communication with Friends and Family: More than three times a week    Frequency of Social Gatherings with Friends and Family: Patient declined    Attends Religious Services: Patient declined    Database Administrator or Organizations: Yes    Attends Engineer, Structural: More than 4 times per year    Marital Status: Divorced  Catering Manager Violence: Not on file    FAMILY HISTORY: Family History  Problem Relation Age of Onset   Hypertension Father    Stroke Father    Alcoholism Father    Alcoholism Sister    Alcoholism Sister    Alcoholism Sister    Alcoholism Sister    Alcoholism Brother    Alcoholism Brother    Alcoholism Brother     ALLERGIES:  is allergic to erythromycin and penicillins.  MEDICATIONS:  Current Outpatient Medications  Medication Sig Dispense Refill   amLODipine  (NORVASC ) 5 MG tablet Take 1 tablet (5 mg total) by mouth daily. 90 tablet 1   amphetamine -dextroamphetamine  (ADDERALL) 30 MG tablet Take 1 tablet by mouth 2 (two) times daily. 60 tablet 0   nebivolol  (BYSTOLIC ) 5 MG tablet Take 1 tablet (5 mg total) by mouth daily. 30 tablet 0   [START ON 10/21/2024] nicotine (NICODERM CQ - DOSED IN MG/24 HR) 7 mg/24hr patch Place 1 patch (7 mg total) onto the skin daily. 28 patch 0   rosuvastatin  (CRESTOR ) 20 MG tablet Take 1 tablet (20 mg total) by mouth daily. 90 tablet 1   sucralfate (CARAFATE) 1 g tablet Take 1 tablet (1 g total) by mouth 2 (two) times daily on an empty stomach. 60 tablet 1   triamcinolone  cream (KENALOG ) 0.1 % Apply to affected area(s) twice daily as needed 30 g 1    No current facility-administered medications for this visit.    REVIEW OF SYSTEMS:   Constitutional: Denies fevers, chills or abnormal night sweats Eyes: Denies blurriness of vision, double vision or watery eyes Ears, nose, mouth, throat, and face: Denies mucositis or sore throat Respiratory: Denies cough, dyspnea or wheezes Cardiovascular: Denies palpitation, chest discomfort or lower extremity swelling Gastrointestinal:  Denies nausea, heartburn or change in bowel habits Skin: Denies abnormal skin rashes Lymphatics: Denies new lymphadenopathy or easy bruising Neurological:Denies numbness, tingling or new weaknesses Behavioral/Psych: Mood is stable, no new changes  All other systems were reviewed with the patient and are negative.  PHYSICAL EXAMINATION: ECOG PERFORMANCE STATUS: 1 - Symptomatic but completely ambulatory  Vitals:   09/23/24 1510 09/23/24 1522  BP: (!) 145/110 (!) 141/103  Pulse: 87   Resp: 20 20  Temp: 97.9 F (36.6 C)  SpO2: 98%    Filed Weights   09/23/24 1510  Weight: 193 lb 11.2 oz (87.9 kg)    GENERAL:alert, no distress and comfortable SKIN: skin color, texture, turgor are normal, no rashes or significant lesions EYES: normal, conjunctiva are pink and non-injected, sclera clear OROPHARYNX:no exudate, no erythema and lips, buccal mucosa, and tongue normal  NECK: supple, thyroid normal size, non-tender, without nodularity LYMPH:  no palpable lymphadenopathy in the cervical, axillary or inguinal LUNGS: clear to auscultation and percussion with normal breathing effort HEART: regular rate & rhythm and no murmurs and no lower extremity edema ABDOMEN:abdomen soft, non-tender and normal bowel sounds Musculoskeletal:no cyanosis of digits and no clubbing  PSYCH: alert & oriented x 3 with fluent speech NEURO: no focal motor/sensory deficits  Physical Exam VITALS: BP- 141/103 MEASUREMENTS: Weight- 193. ABDOMEN: Mild tenderness in the stomach area on  palpation.  LABORATORY DATA:  I have reviewed the data as listed    Latest Ref Rng & Units 06/28/2023    4:18 PM 03/21/2023   11:04 AM 09/23/2022   10:00 AM  CBC  WBC 4.0 - 10.5 K/uL 9.2  11.1  9.6   Hemoglobin 12.0 - 15.0 g/dL 84.7  85.8  85.0   Hematocrit 36.0 - 46.0 % 46.6  42.1  45.4   Platelets 150.0 - 400.0 K/uL 294.0  280  245.0       Latest Ref Rng & Units 06/28/2023    4:18 PM 03/21/2023   11:04 AM 09/23/2022   10:00 AM  CMP  Glucose 70 - 99 mg/dL 96  95  898   BUN 6 - 23 mg/dL 15  17  11    Creatinine 0.40 - 1.20 mg/dL 8.67  8.31  8.87   Sodium 135 - 145 mEq/L 134  135  133   Potassium 3.5 - 5.1 mEq/L 3.7  3.1  3.7   Chloride 96 - 112 mEq/L 102  100  103   CO2 19 - 32 mEq/L 25  24  25    Calcium  8.4 - 10.5 mg/dL 9.4  8.7  8.5   Total Protein 6.5 - 8.1 g/dL  7.6  7.0   Total Bilirubin 0.3 - 1.2 mg/dL  0.6  0.5   Alkaline Phos 38 - 126 U/L  64  64   AST 15 - 41 U/L  24  16   ALT 0 - 44 U/L  19  13      RADIOGRAPHIC STUDIES: I have personally reviewed the radiological images as listed and agreed with the findings in the report. No results found.   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 Symptoms include difficulty swallowing both solids and liquids, with partial obstruction and burning sensation in the esophagus. Biopsy confirmed poorly differentiated adenocarcinoma, indicating an aggressive form of cancer. Likely locally advanced due to significant symptoms and weight loss.  - I recommend staging PET scan and endoscopy ultrasound to determine the extent of disease.  - If no distant metastasis and tumor is T2 or above, or positive node on PET scan, then I recommended neoadjuvant chemotherapy regimen (FLOT) for 4 months.  I briefly discussed the  benefit of chemotherapy and potential side effect.  Surgery involves partial esophagectomy with potential need for a feeding tube post-operatively. Goal is cure if cancer is localized, but treatment is challenging due  to aggressive nature of adenocarcinoma. - Referred to surgeon Dr. Shyrl for surgical evaluation. - Will schedule port placement for chemotherapy administration. - Plan for chemotherapy regimen (FLOT) every two weeks. - Will discuss potential need for feeding tube if swallowing worsens. - Referred to dietitian for nutritional support. - Encouraged liquid nutrition intake to maintain weight. - I have requested molecular testing including MMR, PD-L1 and HER2 test on her biopsy, to see if she is a candidate for immunotherapy and or targeted therapy.  Constipation Reports constipation without current use of laxatives. No significant abdominal pain reported. - Recommended Miralax for constipation management. - Discussed use of suppositories if oral laxatives are not preferred.  Essential hypertension Hypertension managed with medication. Blood pressure today was 141/103 mmHg, possibly elevated due to anxiety. No home monitoring currently in place. - Advised home blood pressure monitoring to assess true blood pressure levels.  Nicotine dependence, cigarettes Long-term smoker with current use of approximately half a pack per day. Previous attempt to quit using expired nicotine patches. Discussed risks of nicotine patches and alternative cessation methods. - Prescribed nicotine patches for smoking cessation. - Discussed potential use of smoking cessation counseling, though she declined.  Plan - I reviewed her EGD report and biopsy results and discussed with patient in detail. - PET scan and EUS for staging - Referred to thoracic surgeon Dr. Shyrl - Follow-up after PET scan and EUS, to finalize her neoadjuvant therapy. - Urgent dietitian consult - Patient was seen by our  social worker today and I encouraged her to follow-up for anxiety counseling   Orders Placed This Encounter  Procedures   NM PET Image Initial (PI) Skull Base To Thigh    Standing Status:   Future    Expected Date:   09/26/2024    Expiration Date:   09/23/2025    If indicated for the ordered procedure, I authorize the administration of a radiopharmaceutical per Radiology protocol:   Yes    Is the patient pregnant?:   No    Preferred imaging location?:   [facility]   CBC with Differential/Platelet    Standing Status:   Standing    Number of Occurrences:   50    Expiration Date:   09/23/2025   Comprehensive metabolic panel with GFR    Standing Status:   Standing    Number of Occurrences:   50    Expiration Date:   09/23/2025   CEA (Access)    Standing Status:   Standing    Number of Occurrences:   30    Expiration Date:   09/23/2025   Ferritin    Standing Status:   Standing    Number of Occurrences:   20    Expiration Date:   09/23/2025   Vitamin B12    Standing Status:   Future    Expected Date:   09/23/2024    Expiration Date:   09/23/2025   Folate RBC    Standing Status:   Future    Expiration Date:   09/23/2025    All questions were answered. The patient knows to call the clinic with any problems, questions or concerns. I spent 50 minutes counseling the patient face to face. The total time spent in the appointment was 60 minutes including review of chart and various tests results, discussions about plan of care and coordination of care plan.     Onita Mattock, MD 09/23/2024 5:10 PM  '

## 2024-09-25 ENCOUNTER — Other Ambulatory Visit: Payer: Self-pay | Admitting: Gastroenterology

## 2024-09-27 ENCOUNTER — Other Ambulatory Visit: Payer: Self-pay | Admitting: Nurse Practitioner

## 2024-09-27 DIAGNOSIS — C155 Malignant neoplasm of lower third of esophagus: Secondary | ICD-10-CM

## 2024-09-27 NOTE — Progress Notes (Signed)
 PATIENT NAVIGATOR PROGRESS NOTE  Name: Toni Dougherty Date: 09/27/2024 MRN: [id number]  DOB: 1971/06/22   Reason for visit:  Phone call follow-up  Comments:   Called patient to follow-up with her regarding upcoming appointments for labs, scans, procedures, and follow-up with our office.  Made patient aware of lab appointment on 11/17 prior to her PET scan. Patient was also informed our schedulers would be reaching out to schedule a follow-up with Dr. Lanny on 11/20 and also to schedule her for port a cath placement in anticipation for start of chemo.  Patient verbalized understanding and agreed to call office with any future questions or concerns.  Scheduling message sent to schedule patient for follow-up with Dr. Lanny and also for port placement.    Time spent counseling/coordinating care: 30-45 minutes

## 2024-09-27 NOTE — Progress Notes (Signed)
 PET/CT denied per patient's insurance. New order for CT CAP placed STAT. Patient to be notified of change and for new radiology procedure to be scheduled.  -Powell Lessen, NP

## 2024-09-28 ENCOUNTER — Ambulatory Visit (HOSPITAL_BASED_OUTPATIENT_CLINIC_OR_DEPARTMENT_OTHER)
Admission: RE | Admit: 2024-09-28 | Discharge: 2024-09-28 | Disposition: A | Discharge status: Home / Self Care | Source: Ambulatory Visit | Attending: Nurse Practitioner | Admitting: Nurse Practitioner

## 2024-09-28 DIAGNOSIS — K449 Diaphragmatic hernia without obstruction or gangrene: Secondary | ICD-10-CM | POA: Diagnosis not present

## 2024-09-28 DIAGNOSIS — C155 Malignant neoplasm of lower third of esophagus: Secondary | ICD-10-CM | POA: Insufficient documentation

## 2024-09-28 LAB — POCT I-STAT CREATININE: Creatinine, Ser: 1.1 mg/dL — ABNORMAL HIGH (ref 0.44–1.00)

## 2024-09-28 MED ORDER — IOHEXOL 300 MG/ML  SOLN
100.0000 mL | Freq: Once | INTRAMUSCULAR | Status: AC | PRN
  Administered 2024-09-28: 100 mL via INTRAVENOUS

## 2024-09-30 ENCOUNTER — Inpatient Hospital Stay

## 2024-09-30 ENCOUNTER — Encounter (HOSPITAL_COMMUNITY)

## 2024-09-30 DIAGNOSIS — Z7952 Long term (current) use of systemic steroids: Secondary | ICD-10-CM | POA: Diagnosis not present

## 2024-09-30 DIAGNOSIS — F17210 Nicotine dependence, cigarettes, uncomplicated: Secondary | ICD-10-CM | POA: Diagnosis not present

## 2024-09-30 DIAGNOSIS — I10 Essential (primary) hypertension: Secondary | ICD-10-CM | POA: Diagnosis not present

## 2024-09-30 DIAGNOSIS — K222 Esophageal obstruction: Secondary | ICD-10-CM | POA: Diagnosis not present

## 2024-09-30 DIAGNOSIS — C155 Malignant neoplasm of lower third of esophagus: Secondary | ICD-10-CM

## 2024-09-30 DIAGNOSIS — D649 Anemia, unspecified: Secondary | ICD-10-CM

## 2024-09-30 DIAGNOSIS — F909 Attention-deficit hyperactivity disorder, unspecified type: Secondary | ICD-10-CM | POA: Diagnosis not present

## 2024-09-30 DIAGNOSIS — Z79899 Other long term (current) drug therapy: Secondary | ICD-10-CM | POA: Diagnosis not present

## 2024-09-30 DIAGNOSIS — K5900 Constipation, unspecified: Secondary | ICD-10-CM | POA: Diagnosis not present

## 2024-09-30 LAB — COMPREHENSIVE METABOLIC PANEL WITH GFR
ALT: 7 U/L (ref 0–44)
AST: 14 U/L — ABNORMAL LOW (ref 15–41)
Albumin: 3.9 g/dL (ref 3.5–5.0)
Alkaline Phosphatase: 78 U/L (ref 38–126)
Anion gap: 12 (ref 5–15)
BUN: 10 mg/dL (ref 6–20)
CO2: 26 mmol/L (ref 22–32)
Calcium: 9.6 mg/dL (ref 8.9–10.3)
Chloride: 99 mmol/L (ref 98–111)
Creatinine, Ser: 1.15 mg/dL — ABNORMAL HIGH (ref 0.44–1.00)
GFR, Estimated: 56 mL/min — ABNORMAL LOW (ref >60)
Glucose, Bld: 111 mg/dL — ABNORMAL HIGH (ref 70–99)
Potassium: 3.6 mmol/L (ref 3.5–5.1)
Sodium: 137 mmol/L (ref 135–145)
Total Bilirubin: 0.3 mg/dL (ref 0.0–1.2)
Total Protein: 8 g/dL (ref 6.5–8.1)

## 2024-09-30 LAB — CBC WITH DIFFERENTIAL/PLATELET
Abs Immature Granulocytes: 0.02 K/uL (ref 0.00–0.07)
Basophils Absolute: 0 K/uL (ref 0.0–0.1)
Basophils Relative: 1 %
Eosinophils Absolute: 0.1 K/uL (ref 0.0–0.5)
Eosinophils Relative: 1 %
HCT: 47.2 % — ABNORMAL HIGH (ref 36.0–46.0)
Hemoglobin: 15.7 g/dL — ABNORMAL HIGH (ref 12.0–15.0)
Immature Granulocytes: 0 %
Lymphocytes Relative: 28 %
Lymphs Abs: 1.8 K/uL (ref 0.7–4.0)
MCH: 30.1 pg (ref 26.0–34.0)
MCHC: 33.3 g/dL (ref 30.0–36.0)
MCV: 90.4 fL (ref 80.0–100.0)
Monocytes Absolute: 0.4 K/uL (ref 0.1–1.0)
Monocytes Relative: 5 %
Neutro Abs: 4.4 K/uL (ref 1.7–7.7)
Neutrophils Relative %: 65 %
Platelets: 346 K/uL (ref 150–400)
RBC: 5.22 MIL/uL — ABNORMAL HIGH (ref 3.87–5.11)
RDW: 13.1 % (ref 11.5–15.5)
WBC: 6.6 K/uL (ref 4.0–10.5)
nRBC: 0 % (ref 0.0–0.2)

## 2024-09-30 LAB — FERRITIN: Ferritin: 125 ng/mL (ref 11–307)

## 2024-09-30 LAB — VITAMIN B12: Vitamin B-12: 302 pg/mL (ref 180–914)

## 2024-09-30 LAB — CEA (ACCESS): CEA (CHCC): 7.89 ng/mL — ABNORMAL HIGH (ref 0.00–5.00)

## 2024-10-01 ENCOUNTER — Encounter (HOSPITAL_COMMUNITY): Payer: Self-pay | Admitting: Gastroenterology

## 2024-10-01 LAB — FOLATE RBC
Folate, Hemolysate: 312 ng/mL
Folate, RBC: 657 ng/mL (ref >498)
Hematocrit: 47.5 % — ABNORMAL HIGH (ref 34.0–46.6)

## 2024-10-02 ENCOUNTER — Ambulatory Visit (HOSPITAL_COMMUNITY)
Admission: RE | Admit: 2024-10-02 | Discharge: 2024-10-02 | Disposition: A | Discharge status: Home / Self Care | Attending: Gastroenterology | Admitting: Gastroenterology

## 2024-10-02 ENCOUNTER — Encounter (HOSPITAL_COMMUNITY): Payer: Self-pay | Admitting: Gastroenterology

## 2024-10-02 ENCOUNTER — Ambulatory Visit (HOSPITAL_COMMUNITY): Admitting: Anesthesiology

## 2024-10-02 ENCOUNTER — Encounter (HOSPITAL_COMMUNITY)
Admission: RE | Disposition: A | Discharge status: Home / Self Care | Payer: Self-pay | Source: Home / Self Care | Attending: Gastroenterology

## 2024-10-02 ENCOUNTER — Other Ambulatory Visit: Payer: Self-pay

## 2024-10-02 ENCOUNTER — Telehealth: Payer: Self-pay | Admitting: *Deleted

## 2024-10-02 DIAGNOSIS — I10 Essential (primary) hypertension: Secondary | ICD-10-CM | POA: Insufficient documentation

## 2024-10-02 DIAGNOSIS — C155 Malignant neoplasm of lower third of esophagus: Secondary | ICD-10-CM | POA: Diagnosis not present

## 2024-10-02 DIAGNOSIS — K2281 Esophageal polyp: Secondary | ICD-10-CM | POA: Diagnosis not present

## 2024-10-02 DIAGNOSIS — Z79899 Other long term (current) drug therapy: Secondary | ICD-10-CM | POA: Insufficient documentation

## 2024-10-02 DIAGNOSIS — K5900 Constipation, unspecified: Secondary | ICD-10-CM | POA: Insufficient documentation

## 2024-10-02 DIAGNOSIS — K2289 Other specified disease of esophagus: Secondary | ICD-10-CM | POA: Insufficient documentation

## 2024-10-02 HISTORY — PX: ESOPHAGOGASTRODUODENOSCOPY: SHX5428

## 2024-10-02 HISTORY — PX: EUS: SHX5427

## 2024-10-02 SURGERY — ULTRASOUND, UPPER GI TRACT, ENDOSCOPIC
Anesthesia: Monitor Anesthesia Care

## 2024-10-02 MED ORDER — PROPOFOL 500 MG/50ML IV EMUL
INTRAVENOUS | Status: DC | PRN
  Administered 2024-10-02: 20 mg via INTRAVENOUS
  Administered 2024-10-02: 200 ug/kg/min via INTRAVENOUS
  Administered 2024-10-02: 20 mg via INTRAVENOUS
  Administered 2024-10-02: 30 mg via INTRAVENOUS

## 2024-10-02 MED ORDER — SODIUM CHLORIDE 0.9 % IV SOLN: INTRAVENOUS | Status: DC

## 2024-10-02 MED ORDER — LIDOCAINE 2% (20 MG/ML) 5 ML SYRINGE
INTRAMUSCULAR | Status: DC | PRN
  Administered 2024-10-02: 100 mg via INTRAVENOUS

## 2024-10-02 NOTE — Op Note (Signed)
 [facility] Patient Name: Annalea Alguire Procedure Date: 10/02/2024 MRN: [id number] Attending MD: Elsie Cree , MD, [id number] Date of Birth: 04-Feb-1971 CSN: [id number] Age: 53 Admit Type: Outpatient Procedure:                Upper EUS Indications:              Esophageal mucosal mass/polyp found on endoscopy,                            Staging of esophageal adenocarcinoma Providers:                Elsie Cree, MD, Robie Breed, RN,                            Haskel Chris, Technician Referring MD:             Dr. Estefana Keas, Dr. Lanny (oncology) Medicines:                Monitored Anesthesia Care Complications:            No immediate complications. Estimated Blood Loss:     Estimated blood loss: none. Procedure:                Pre-Anesthesia Assessment:                           - Prior to the procedure, a History and Physical                            was performed, and patient medications and                            allergies were reviewed. The patient's tolerance of                            previous anesthesia was also reviewed. The risks                            and benefits of the procedure and the sedation                            options and risks were discussed with the patient.                            All questions were answered, and informed consent                            was obtained. Prior Anticoagulants: The patient has                            taken no anticoagulant or antiplatelet agents. ASA                            Grade Assessment: III - A patient with severe  systemic disease. After reviewing the risks and                            benefits, the patient was deemed in satisfactory                            condition to undergo the procedure.                           After obtaining informed consent, the endoscope was                            passed under direct vision. Throughout  the                            procedure, the patient's blood pressure, pulse, and                            oxygen saturations were monitored continuously. The                            [id number]-[id number] ([id number]) Olympus endoscope was introduced                            through the mouth, and advanced to the lower third                            of esophagus. The RADIAL EUS ( [id number] ) [id number]                            was introduced through the mouth, and advanced to                            the lower third of esophagus. The upper EUS was                            accomplished without difficulty. The patient                            tolerated the procedure well. Scope In: Scope Out: Findings:      ENDOSCOPIC FINDING: :      A large, fungating and ulcerating mass with no stigmata of recent       bleeding was found in the lower third of the esophagus, 36 cm from the       incisors. The mass was partially obstructing and circumferential. Unable       to pass diagnostic endoscope through the tumor stricture.      ENDOSONOGRAPHIC FINDING: :      Diffuse wall thickening was visualized endosonographically in the lower       third of the esophagus. This appeared to be primarily due to thickening       of the luminal interface/superficial mucosa (Layer 1). Additionally,       wall thickening was noted in the following wall layer(s): muscularis  propria (Layer 4). Unable to pass EUS radial echoendoscope through the       tumor stricture.      A few malignant-appearing lymph nodes were visualized in the lower       paraesophageal mediastinum (level 8L), largest well over 1 x 1 cm in       diameter. The nodes were round, hypoechoic and had well defined margins. Impression:               - Partially obstructing, malignant esophageal tumor                            was found in the lower third of the esophagus.                           - Wall thickening was seen in the lower third of                             the esophagus. The thickening appeared to primarily                            be within the luminal interface/superficial mucosa                            (Layer 1).                           - A few malignant-appearing lymph nodes were                            visualized in the lower paraesophageal mediastinum                            (level 8L).                           - Staging by EUS is at least T3 N2 Mx. Moderate Sedation:      None Recommendation:           - Discharge patient to home (via wheelchair).                           - Soft diet indefinitely.                           - Continue present medications.                           - Return to GI clinic PRN.                           - Follow-up with oncology. Based on EUS and CT                            results, patient does not appear to be candidate  for upfront surgical intervention. Procedure Code(s):        --- Professional ---                           [phone], Esophagoscopy, flexible, transoral; with                            endoscopic ultrasound examination Diagnosis Code(s):        --- Professional ---                           C15.5, Malignant neoplasm of lower third of                            esophagus                           K22.89, Other specified disease of esophagus                           I89.9, Noninfective disorder of lymphatic vessels                            and lymph nodes, unspecified                           K22.81, Esophageal polyp CPT copyright 2022 American Medical Association. All rights reserved. The codes documented in this report are preliminary and upon coder review may  be revised to meet current compliance requirements. Elsie Cree, MD 10/02/2024 12:03:15 PM This report has been signed electronically. Number of Addenda: 0

## 2024-10-02 NOTE — Anesthesia Preprocedure Evaluation (Addendum)
 Anesthesia Evaluation  Patient identified by MRN, date of birth, ID band Patient awake    Reviewed: Allergy & Precautions, NPO status , Patient's Chart, lab work & pertinent test results  History of Anesthesia Complications Negative for: history of anesthetic complications  Airway Mallampati: II  TM Distance: >3 FB Neck ROM: Full    Dental no notable dental hx. (+) Teeth Intact, Dental Advisory Given   Pulmonary Current SmokerPatient did not abstain from smoking.   Pulmonary exam normal breath sounds clear to auscultation       Cardiovascular hypertension, Pt. on medications (-) angina (-) Past MI Normal cardiovascular exam Rhythm:Regular Rate:Normal     Neuro/Psych  PSYCHIATRIC DISORDERS (ADD)      negative neurological ROS     GI/Hepatic ,neg GERD  ,,Dysphagia   Endo/Other  neg diabetes    Renal/GU Lab Results      Component                Value               Date                              K                        3.6                 09/30/2024                CO2                      26                  09/30/2024                BUN                      10                  09/30/2024                CREATININE               1.15 (H)            09/30/2024                                GLUCOSE                  111 (H)             09/30/2024                Musculoskeletal   Abdominal   Peds  Hematology Lab Results      Component                Value               Date                      WBC                      6.6                 09/30/2024  HGB                      15.7 (H)            09/30/2024                HCT                      47.5 (H)            09/30/2024                HCT                      47.2 (H)            09/30/2024                MCV                      90.4                09/30/2024                PLT                      346                 09/30/2024               Anesthesia Other Findings All: erythromucin, PCN  Reproductive/Obstetrics                              Anesthesia Physical Anesthesia Plan  ASA: 3  Anesthesia Plan: MAC   Post-op Pain Management: Minimal or no pain anticipated   Induction: Intravenous  PONV Risk Score and Plan: Treatment may vary due to age or medical condition and Propofol infusion  Airway Management Planned: Natural Airway and Nasal Cannula  Additional Equipment: None  Intra-op Plan:   Post-operative Plan:   Informed Consent: I have reviewed the patients History and Physical, chart, labs and discussed the procedure including the risks, benefits and alternatives for the proposed anesthesia with the patient or authorized representative who has indicated his/her understanding and acceptance.     Dental advisory given  Plan Discussed with:   Anesthesia Plan Comments: (Upper GI ultrasound for esophageal CA)         Anesthesia Quick Evaluation

## 2024-10-02 NOTE — Anesthesia Procedure Notes (Addendum)
 Procedure Name: MAC Date/Time: 10/02/2024 11:26 AM  Performed by: Franchot Delon RAMAN, CRNAPre-anesthesia Checklist: Patient identified, Emergency Drugs available, Suction available and Patient being monitored Oxygen Delivery Method: Nasal cannula Airway Equipment and Method: Bite block Placement Confirmation: positive ETCO2 Dental Injury: Teeth and Oropharynx as per pre-operative assessment  Comments: Optiflow nasal cannula

## 2024-10-02 NOTE — Telephone Encounter (Signed)
 error

## 2024-10-02 NOTE — Transfer of Care (Signed)
 Immediate Anesthesia Transfer of Care Note  Patient: Toni Dougherty  Procedure(s) Performed: ULTRASOUND, UPPER GI TRACT, ENDOSCOPIC  Patient Location: PACU  Anesthesia Type:MAC  Level of Consciousness: awake and alert   Airway & Oxygen Therapy: Patient Spontanous Breathing and Patient connected to nasal cannula oxygen  Post-op Assessment: Report given to RN and Post -op Vital signs reviewed and stable  Post vital signs: Reviewed and stable  Last Vitals:  Vitals Value Taken Time  BP    Temp    Pulse    Resp    SpO2      Last Pain:  Vitals:   10/02/24 1039  TempSrc: Temporal  PainSc: 0-No pain         Complications: No notable events documented.

## 2024-10-02 NOTE — Discharge Instructions (Signed)

## 2024-10-02 NOTE — Interval H&P Note (Signed)
 History and Physical Interval Note:  10/02/2024 11:23 AM  Toni Dougherty  has presented today for surgery, with the diagnosis of esophageal cancer C15.9.  The various methods of treatment have been discussed with the patient and family. After consideration of risks, benefits and other options for treatment, the patient has consented to  Procedure(s): ULTRASOUND, UPPER GI TRACT, ENDOSCOPIC (N/A) as a surgical intervention.  The patient's history has been reviewed, patient examined, no change in status, stable for surgery.  I have reviewed the patient's chart and labs.  Questions were answered to the patient's satisfaction.     [name]

## 2024-10-02 NOTE — Anesthesia Postprocedure Evaluation (Signed)
 Anesthesia Post Note  Patient: Toni Dougherty  Procedure(s) Performed: ULTRASOUND, UPPER GI TRACT, ENDOSCOPIC     Patient location during evaluation: Endoscopy Anesthesia Type: MAC Level of consciousness: awake and alert Pain management: pain level controlled Vital Signs Assessment: post-procedure vital signs reviewed and stable Respiratory status: spontaneous breathing, nonlabored ventilation, respiratory function stable and patient connected to nasal cannula oxygen Cardiovascular status: blood pressure returned to baseline and stable Postop Assessment: no apparent nausea or vomiting Anesthetic complications: no   No notable events documented.  Last Vitals:  Vitals:   10/02/24 1200 10/02/24 1210  BP: 134/63 (!) 176/91  Pulse: (!) 55 (!) 53  Resp: 17 (!) 25  Temp:    SpO2: 100% 100%    Last Pain:  Vitals:   10/02/24 1210  TempSrc:   PainSc: 0-No pain                 Garnette DELENA Gab

## 2024-10-03 ENCOUNTER — Other Ambulatory Visit: Payer: Self-pay

## 2024-10-03 ENCOUNTER — Inpatient Hospital Stay (HOSPITAL_BASED_OUTPATIENT_CLINIC_OR_DEPARTMENT_OTHER): Admitting: Hematology

## 2024-10-03 ENCOUNTER — Other Ambulatory Visit (HOSPITAL_BASED_OUTPATIENT_CLINIC_OR_DEPARTMENT_OTHER): Payer: Self-pay

## 2024-10-03 ENCOUNTER — Encounter: Payer: Self-pay | Admitting: Hematology

## 2024-10-03 ENCOUNTER — Encounter (HOSPITAL_COMMUNITY): Payer: Self-pay | Admitting: Gastroenterology

## 2024-10-03 ENCOUNTER — Encounter: Payer: Self-pay | Admitting: Anatomic Pathology & Clinical Pathology

## 2024-10-03 DIAGNOSIS — Z7952 Long term (current) use of systemic steroids: Secondary | ICD-10-CM | POA: Diagnosis not present

## 2024-10-03 DIAGNOSIS — C155 Malignant neoplasm of lower third of esophagus: Secondary | ICD-10-CM | POA: Diagnosis not present

## 2024-10-03 DIAGNOSIS — Z79899 Other long term (current) drug therapy: Secondary | ICD-10-CM | POA: Diagnosis not present

## 2024-10-03 DIAGNOSIS — F17210 Nicotine dependence, cigarettes, uncomplicated: Secondary | ICD-10-CM | POA: Diagnosis not present

## 2024-10-03 DIAGNOSIS — K5900 Constipation, unspecified: Secondary | ICD-10-CM | POA: Diagnosis not present

## 2024-10-03 DIAGNOSIS — I10 Essential (primary) hypertension: Secondary | ICD-10-CM | POA: Diagnosis not present

## 2024-10-03 DIAGNOSIS — K222 Esophageal obstruction: Secondary | ICD-10-CM | POA: Diagnosis not present

## 2024-10-03 DIAGNOSIS — F909 Attention-deficit hyperactivity disorder, unspecified type: Secondary | ICD-10-CM | POA: Diagnosis not present

## 2024-10-03 DIAGNOSIS — C159 Malignant neoplasm of esophagus, unspecified: Secondary | ICD-10-CM | POA: Insufficient documentation

## 2024-10-03 MED ORDER — DEXAMETHASONE 4 MG PO TABS
8.0000 mg | ORAL_TABLET | Freq: Every day | ORAL | 1 refills | Status: DC
  Filled 2024-10-03 – 2024-10-15 (×2): qty 30, 15d supply, fill #0

## 2024-10-03 MED ORDER — LIDOCAINE-PRILOCAINE 2.5-2.5 % EX CREA
TOPICAL_CREAM | CUTANEOUS | 3 refills | Status: DC
  Filled 2024-10-03 – 2024-10-15 (×2): qty 30, 30d supply, fill #0

## 2024-10-03 MED ORDER — PROCHLORPERAZINE MALEATE 10 MG PO TABS
10.0000 mg | ORAL_TABLET | Freq: Four times a day (QID) | ORAL | 1 refills | Status: DC | PRN
  Filled 2024-10-03 – 2024-10-15 (×2): qty 30, 8d supply, fill #0

## 2024-10-03 MED ORDER — ONDANSETRON HCL 8 MG PO TABS
8.0000 mg | ORAL_TABLET | Freq: Three times a day (TID) | ORAL | 1 refills | Status: DC | PRN
  Filled 2024-10-03 – 2024-10-15 (×2): qty 30, 10d supply, fill #0

## 2024-10-03 NOTE — Progress Notes (Signed)
 The proposed treatment discussed in conference is for discussion purpose only and is not a binding recommendation.  The patients have not been physically examined, or presented with their treatment options.  Therefore, final treatment plans cannot be decided.

## 2024-10-03 NOTE — Assessment & Plan Note (Addendum)
 cT3N2M0, MMR normal, Her2 (-), PD-L1 CPS 100% - Patient presented with progressive dysphagia and a 50 pound weight loss -EGD showed a large ulcerated, partially obstructed mass in the distal esophagus, extending into GE junction and gastric cardia, biopsy showed poorly differentiated adenocarcinoma. -PET scan was denied by insurance, CT showed enlarged mediastinal adenopathy, US  staging T3 N2 -I recommend neoadjuvant chemotherapy FLOT and durvalumab

## 2024-10-03 NOTE — Progress Notes (Signed)
 START ON PATHWAY REGIMEN - Gastroesophageal     Cycles 1 through 4: A cycle is every 28 days:     Durvalumab      Docetaxel      Oxaliplatin      Leucovorin      Fluorouracil    Cycles 5 through 14: A cycle is every 28 days:     Durvalumab   **Always confirm dose/schedule in your pharmacy ordering system**  Patient Characteristics: Esophageal & GE Junction, Adenocarcinoma, Preoperative or Nonsurgical Candidate, M0 (Clinical Staging), cT2 or Higher or cN+, Surgical Candidate (Up to cT4a), GE Junction, MSS/pMMR or MSI Unknown Therapeutic Status: Preoperative or Nonsurgical Candidate, M0 (Clinical Staging) Histology: Adenocarcinoma Disease Classification: GE Junction AJCC Grade: G3 AJCC 8 Stage Grouping: IVA AJCC T Category: cT3 AJCC N Category: cN2 AJCC M Category: cM0 Microsatellite/Mismatch Repair Status: MSS/pMMR Intent of Therapy: Curative Intent, Discussed with Patient

## 2024-10-03 NOTE — Progress Notes (Signed)
 [facility]   [phone] [phone]   Clinic Follow up Note   Patient Care Team: Sheldon Netter, [name] as PCP - General (Physician Assistant) Ardis Evalene CROME, RN as Oncology Nurse Navigator 10/03/2024  I connected with Toni Dougherty on 10/03/24 at  4:00 PM EST by telephone and verified that I am speaking with the correct person using two identifiers.   I discussed the limitations, risks, security and privacy concerns of performing an evaluation and management service by telephone and the availability of in person appointments. I also discussed with the patient that there may be a patient responsible charge related to this service. The patient expressed understanding and agreed to proceed.   Patient's location:  work Provider's location:  Office    CHIEF COMPLAINT: f/u esophageal cancer   CURRENT THERAPY: Pending neoadjuvant chemotherapy FLOT and durvalumab  Oncology history Esophageal cancer (HCC) cT3N2M0 - Patient presented with progressive dysphagia and a 50 pound weight loss -EGD showed a large ulcerated, partially obstructed mass in the distal esophagus, extending into GE junction and gastric cardia, biopsy showed poorly differentiated adenocarcinoma. -PET scan was denied by insurance, CT showed enlarged mediastinal adenopathy, US  staging T3 N2 -I recommend neoadjuvant chemotherapy FLOT and durvalumab   Assessment & Plan Malignant neoplasm of lower third of esophagus, stage IVA (T3N2) Esophageal cancer with tumor invasion into the muscular layer (T3) and multiple lymph node involvement (N2). CT scan shows enlarged lymph nodes adjacent to the esophagus, suspicious for metastasis, but no evidence of metastatic disease outside the esophagus and lymph nodes. PET scan pending for further evaluation. Stage IVA indicates locally advanced disease but not metastatic. Immunotherapy is indicated due to positive test results, but immunotherapy alone is  insufficient due to normal MMR status. Treatment plan includes chemotherapy and immunotherapy to shrink the tumor and facilitate potential surgical intervention. - Scheduled PET scan for November 28th, 2025. - Scheduled port placement for November 26th, 2025. - Initiated chemotherapy regimen (FLOT) with immunotherapy (duvalumab) starting December 3rd, 2025. - Scheduled chemotherapy class before first treatment. - Coordinated with dietitian for nutritional support. - Referred to social worker for financial assistance and support.   Plan - I reviewed her US  report, CT scan images and discussed the finding with her - I recommended neoadjuvant chemotherapy FLOT every 2 weeks for 8 cycles, and durvalumab every 4 weeks for 1 year, plan to start in 2 weeks - Patient is scheduled for port placement and PET scan next week - Will schedule chemo education - Plan to start chemo around December 3rd.   SUMMARY OF ONCOLOGIC HISTORY: Oncology History  Esophageal cancer (HCC)  09/18/2024 Cancer Staging   Staging form: Esophagus - Adenocarcinoma, AJCC 8th Edition - Clinical stage from 09/18/2024: Stage IVA (cT3, cN2, cM0, G3) - Signed by Lanny Callander, MD on 10/03/2024 Histologic grading system: 3 grade system   10/03/2024 Initial Diagnosis   Esophageal cancer (HCC)   10/16/2024 -  Chemotherapy   Patient is on Treatment Plan : GASTROESOPHAGEAL Durvalumab q28d + FLOT q14d / Durvalumab q28d       Discussed the use of AI scribe software for clinical note transcription with the patient, who gave verbal consent to proceed.  History of Present Illness Toni Dougherty is a 53 year old female with esophageal cancer who presents for follow-up after recent diagnostic studies.  She maintains a liquid diet, primarily with protein shakes, and has experienced a slight weight loss of a couple of pounds. A recent CT scan shows  enlarged lymph nodes adjacent to the esophageal cancer. She is scheduled for a PET scan on October 11, 2024, to further evaluate the disease extent. Testing for immunotherapy eligibility returned positive. She is scheduled for port placement on October 09, 2024, in preparation for chemotherapy. A phone visit with a dietitian is planned to address nutritional needs during treatment.     REVIEW OF SYSTEMS:   Constitutional: Denies fevers, chills or abnormal weight loss Eyes: Denies blurriness of vision Ears, nose, mouth, throat, and face: Denies mucositis or sore throat Respiratory: Denies cough, dyspnea or wheezes Cardiovascular: Denies palpitation, chest discomfort or lower extremity swelling Gastrointestinal:  Denies nausea, heartburn or change in bowel habits Skin: Denies abnormal skin rashes Lymphatics: Denies new lymphadenopathy or easy bruising Neurological:Denies numbness, tingling or new weaknesses Behavioral/Psych: Mood is stable, no new changes  All other systems were reviewed with the patient and are negative.  MEDICAL HISTORY:  Past Medical History:  Diagnosis Date   ADD (attention deficit disorder)    HTN (hypertension)    Narcolepsy     SURGICAL HISTORY: Past Surgical History:  Procedure Laterality Date   APPENDECTOMY     ESOPHAGOGASTRODUODENOSCOPY N/A 10/02/2024   Procedure: EGD (ESOPHAGOGASTRODUODENOSCOPY);  Surgeon: Burnette Fallow, MD;  Location: [facility];  Service: Gastroenterology;  Laterality: N/A;   EUS N/A 10/02/2024   Procedure: ULTRASOUND, UPPER GI TRACT, ENDOSCOPIC;  Surgeon: Burnette Fallow, MD;  Location: [facility];  Service: Gastroenterology;  Laterality: N/A;   rotate cuff Right 2012    I have reviewed the social history and family history with the patient and they are unchanged from previous note.  ALLERGIES:  is allergic to erythromycin and penicillins.  MEDICATIONS:  Current Outpatient Medications  Medication Sig Dispense Refill   amLODipine  (NORVASC ) 5 MG tablet Take 1 tablet (5 mg total) by mouth daily. 90 tablet 1    amphetamine -dextroamphetamine  (ADDERALL) 30 MG tablet Take 1 tablet by mouth 2 (two) times daily. 60 tablet 0   dexamethasone  (DECADRON ) 4 MG tablet Take 2 tablets (8 mg total) by mouth daily. Start the day after chemotherapy for 2 days. Take with food. 30 tablet 1   lidocaine -prilocaine  (EMLA ) cream Apply to affected area once 30 g 3   nebivolol  (BYSTOLIC ) 5 MG tablet Take 1 tablet (5 mg total) by mouth daily. 30 tablet 0   [START ON 10/21/2024] nicotine  (NICODERM CQ  - DOSED IN MG/24 HR) 7 mg/24hr patch Place 1 patch (7 mg total) onto the skin daily. 28 patch 0   ondansetron  (ZOFRAN ) 8 MG tablet Take 1 tablet (8 mg total) by mouth every 8 (eight) hours as needed for nausea or vomiting. Start on the third day after chemotherapy. 30 tablet 1   prochlorperazine  (COMPAZINE ) 10 MG tablet Take 1 tablet (10 mg total) by mouth every 6 (six) hours as needed for nausea or vomiting. 30 tablet 1   rosuvastatin  (CRESTOR ) 20 MG tablet Take 1 tablet (20 mg total) by mouth daily. 90 tablet 1   sucralfate  (CARAFATE ) 1 g tablet Take 1 tablet (1 g total) by mouth 2 (two) times daily on an empty stomach. 60 tablet 1   triamcinolone  cream (KENALOG ) 0.1 % Apply to affected area(s) twice daily as needed 30 g 1   No current facility-administered medications for this visit.    PHYSICAL EXAMINATION: Not performed   LABORATORY DATA:  I have reviewed the data as listed    Latest Ref Rng & Units 09/30/2024    3:13 PM 06/28/2023  4:18 PM 03/21/2023   11:04 AM  CBC  WBC 4.0 - 10.5 K/uL 6.6  9.2  11.1   Hemoglobin 12.0 - 15.0 g/dL 84.2  84.7  85.8   Hematocrit 34.0 - 46.6 % 36.0 - 46.0 % 47.5    47.2  46.6  42.1   Platelets 150 - 400 K/uL 346  294.0  280         Latest Ref Rng & Units 09/30/2024    3:13 PM 09/28/2024    5:15 PM 06/28/2023    4:18 PM  CMP  Glucose 70 - 99 mg/dL 888   96   BUN 6 - 20 mg/dL 10   15   Creatinine 9.55 - 1.00 mg/dL 8.84  8.89  8.67   Sodium 135 - 145 mmol/L 137   134   Potassium  3.5 - 5.1 mmol/L 3.6   3.7   Chloride 98 - 111 mmol/L 99   102   CO2 22 - 32 mmol/L 26   25   Calcium  8.9 - 10.3 mg/dL 9.6   9.4   Total Protein 6.5 - 8.1 g/dL 8.0     Total Bilirubin 0.0 - 1.2 mg/dL 0.3     Alkaline Phos 38 - 126 U/L 78     AST 15 - 41 U/L 14     ALT 0 - 44 U/L 7         RADIOGRAPHIC STUDIES: I have personally reviewed the radiological images as listed and agreed with the findings in the report. No results found.     I discussed the assessment and treatment plan with the patient. The patient was provided an opportunity to ask questions and all were answered. The patient agreed with the plan and demonstrated an understanding of the instructions.   The patient was advised to call back or seek an in-person evaluation if the symptoms worsen or if the condition fails to improve as anticipated.  I provided 40 minutes of non face-to-face telephone visit time during this encounter, including review of chart and various tests results, discussions about plan of care and coordination of care plan.    Onita Mattock, MD 10/03/24

## 2024-10-04 ENCOUNTER — Inpatient Hospital Stay: Admitting: Dietician

## 2024-10-04 ENCOUNTER — Other Ambulatory Visit: Payer: Self-pay

## 2024-10-04 ENCOUNTER — Encounter: Payer: Self-pay | Admitting: Hematology

## 2024-10-04 ENCOUNTER — Telehealth: Payer: Self-pay | Admitting: Hematology

## 2024-10-04 ENCOUNTER — Encounter: Admitting: Thoracic Surgery (Cardiothoracic Vascular Surgery)

## 2024-10-04 ENCOUNTER — Telehealth: Payer: Self-pay | Admitting: Dietician

## 2024-10-04 NOTE — Telephone Encounter (Signed)
 Called pt and schedule her for chemo and confirm time and date.

## 2024-10-04 NOTE — Telephone Encounter (Signed)
 Nutrition Assessment   Reason for Assessment: Referral    ASSESSMENT: 53 year old female with newly diagnosed malignant neoplasm of lower third of esophagus. Planning to receive chemotherapy with FLOT q14d + durvalumab q28d. Patient is under the care of Dr. Lanny.   Past medical history includes HTN, left ventricular hypertrophy, ADHD, hypergylceridemia, dyslipidemia, tobacco use, alcohol use  Spoke with patient via telephone. Patient tearful during conversation. She reports no appetite, but shoving it down. She is unable to tolerate solid foods. Recalls majority of po is broth and water. Patient has tried Ensure/Boost. Can not stand the taste. Reports lactose intolerance. Patient says vomits all the time. She does not take antiemetics as she is not nausea. Episodes usually occur with intake. Says the smallest amount of texture will provoke an episode. Patient states she just wants to feel like a normal human. She does not have family here. Reports her co-workers have been artist, however waiting for the day when they stop coming around.     Medications: amlodipine , adderall, bystolic , nicoderm, zofran , compazine , crestor , carafate    Labs: 11/17 - glucose 111, Cr 1.15   Anthropometrics:   Height: 6 Weight: 180 lb (11/19) UBW: 239 lb (11/16/23) BMI: 24.41   NUTRITION DIAGNOSIS: Unintended wt loss related to cancer of GE Junction as evidenced by dysphagia, vomiting, 25% wt loss in the last 10 months with is severe for time frame   MALNUTRITION DIAGNOSIS: Suspect degree of malnutrition, will plan NFPE at in person follow-up   INTERVENTION:  Employed active listening, support and encouragement Recommend small amounts of liquids that offer calories and protein q2h (bone broth, Ensure Clear, Gatorade zero, well strained soups) LCSW following    MONITORING, EVALUATION, GOAL: Pt will tolerate increased calories and protein to minimize further wt loss    Next Visit: To be scheduled  with treatment

## 2024-10-06 ENCOUNTER — Other Ambulatory Visit: Payer: Self-pay

## 2024-10-07 ENCOUNTER — Telehealth: Payer: Self-pay | Admitting: Hematology

## 2024-10-07 NOTE — Telephone Encounter (Signed)
 Called to inform upcoming appt and pt is aware of time and date.

## 2024-10-08 ENCOUNTER — Other Ambulatory Visit: Payer: Self-pay | Admitting: Radiology

## 2024-10-08 NOTE — H&P (Signed)
 Chief Complaint: Esophageal cancer; referred for port a cath placement to assist with treatment  Referring Provider(s): Feng,Y  Supervising Physician: Jennefer Rover  Patient Status: [facility] - Out-pt  History of Present Illness: Toni Dougherty is a 53 y.o. female smoker with past medical history significant for ADD, hypertension, narcolepsy who presents now with recently diagnosed esophageal carcinoma.  She has poor venous access and is scheduled today for Port-A-Cath placement to assist with treatment.   Patient is Full Code  Past Medical History:  Diagnosis Date   ADD (attention deficit disorder)    HTN (hypertension)    Narcolepsy     Past Surgical History:  Procedure Laterality Date   APPENDECTOMY     ESOPHAGOGASTRODUODENOSCOPY N/A 10/02/2024   Procedure: EGD (ESOPHAGOGASTRODUODENOSCOPY);  Surgeon: Burnette Fallow, MD;  Location: [facility];  Service: Gastroenterology;  Laterality: N/A;   EUS N/A 10/02/2024   Procedure: ULTRASOUND, UPPER GI TRACT, ENDOSCOPIC;  Surgeon: Burnette Fallow, MD;  Location: [facility];  Service: Gastroenterology;  Laterality: N/A;   rotate cuff Right 2012    Allergies: Erythromycin and Penicillins  Medications: Prior to Admission medications   Medication Sig Start Date End Date Taking? Authorizing Provider  amLODipine  (NORVASC ) 5 MG tablet Take 1 tablet (5 mg total) by mouth daily. 02/02/24     amphetamine -dextroamphetamine  (ADDERALL) 30 MG tablet Take 1 tablet by mouth 2 (two) times daily. 02/02/24     dexamethasone  (DECADRON ) 4 MG tablet Take 2 tablets (8 mg total) by mouth daily. Start the day after chemotherapy for 2 days. Take with food. 10/03/24   Lanny Callander, MD  lidocaine -prilocaine  (EMLA ) cream Apply to affected area once 10/03/24   Lanny Callander, MD  nebivolol  (BYSTOLIC ) 5 MG tablet Take 1 tablet (5 mg total) by mouth daily. 11/16/23   Joshua Debby CROME, MD  nicotine  (NICODERM CQ  - DOSED IN MG/24 HR) 7 mg/24hr patch Place 1 patch (7 mg total)  onto the skin daily. 10/21/24   Lanny Callander, MD  ondansetron  (ZOFRAN ) 8 MG tablet Take 1 tablet (8 mg total) by mouth every 8 (eight) hours as needed for nausea or vomiting. Start on the third day after chemotherapy. 10/03/24   Lanny Callander, MD  prochlorperazine  (COMPAZINE ) 10 MG tablet Take 1 tablet (10 mg total) by mouth every 6 (six) hours as needed for nausea or vomiting. 10/03/24   Lanny Callander, MD  rosuvastatin  (CRESTOR ) 20 MG tablet Take 1 tablet (20 mg total) by mouth daily. 07/03/23   Joshua Debby CROME, MD  sucralfate  (CARAFATE ) 1 g tablet Take 1 tablet (1 g total) by mouth 2 (two) times daily on an empty stomach. 09/10/24     triamcinolone  cream (KENALOG ) 0.1 % Apply to affected area(s) twice daily as needed 03/08/24        Family History  Problem Relation Age of Onset   Hypertension Father    Stroke Father    Alcoholism Father    Alcoholism Sister    Alcoholism Sister    Alcoholism Sister    Alcoholism Sister    Alcoholism Brother    Alcoholism Brother    Alcoholism Brother     Social History   Socioeconomic History   Marital status: Single    Spouse name: Not on file   Number of children: 2   Years of education: Not on file   Highest education level: Bachelor's degree (e.g., BA, AB, BS)  Occupational History   Not on file  Tobacco Use   Smoking status: Every Day  Current packs/day: 0.50    Average packs/day: 0.5 packs/day for 25.0 years (12.5 ttl pk-yrs)    Types: Cigarettes   Smokeless tobacco: Never  Vaping Use   Vaping status: Never Used  Substance and Sexual Activity   Alcohol use: Yes    Alcohol/week: 10.0 standard drinks of alcohol    Types: 10 Shots of liquor per week   Drug use: Not Currently   Sexual activity: Yes    Partners: Male    Birth control/protection: I.U.D., Condom  Other Topics Concern   Not on file  Social History Narrative   Not on file   Social Drivers of Health   Financial Resource Strain: Patient Declined (11/16/2023)   Overall Financial  Resource Strain (CARDIA)    Difficulty of Paying Living Expenses: Patient declined  Food Insecurity: Food Insecurity Present (09/21/2024)   Hunger Vital Sign    Worried About Running Out of Food in the Last Year: Often true    Ran Out of Food in the Last Year: Not on file  Transportation Needs: Unmet Transportation Needs (09/21/2024)   PRAPARE - Administrator, Civil Service (Medical): Yes    Lack of Transportation (Non-Medical): Yes  Physical Activity: Insufficiently Active (11/16/2023)   Exercise Vital Sign    Days of Exercise per Week: 1 day    Minutes of Exercise per Session: 30 min  Stress: Patient Declined (11/16/2023)   [employer] of Occupational Health - Occupational Stress Questionnaire    Feeling of Stress : Patient declined  Social Connections: Unknown (11/16/2023)   Social Connection and Isolation Panel    Frequency of Communication with Friends and Family: More than three times a week    Frequency of Social Gatherings with Friends and Family: Patient declined    Attends Religious Services: Patient declined    Database Administrator or Organizations: Yes    Attends Engineer, Structural: More than 4 times per year    Marital Status: Divorced       Review of Systems; denies fever,HA,CP,dyspnea, cough, abd/back pain,N/V or bleeding; she does have dysphagia  Vital Signs: Vitals:   10/09/24 1253  BP: (!) 149/104  Pulse: (!) 54  Resp: 20  Temp: 98 F (36.7 C)  SpO2: 97%      Advance Care Plan: No documents on file    Physical Exam: awake/alert; chest- CTA bilat; heart- sl bradycardic but reg rhythm; abd-soft,+BS,NT; no LE edema  Imaging: CT CHEST ABDOMEN PELVIS W CONTRAST Result Date: 09/28/2024 EXAM: CT CHEST, ABDOMEN AND PELVIS WITH CONTRAST 09/28/2024 05:24:44 PM TECHNIQUE: CT of the chest, abdomen and pelvis was performed with the administration of 100 mL of iohexol  (OMNIPAQUE ) 300 MG/ML solution. Multiplanar reformatted images are  provided for review. Automated exposure control, iterative reconstruction, and/or weight based adjustment of the mA/kV was utilized to reduce the radiation dose to as low as reasonably achievable. COMPARISON: None available. CLINICAL HISTORY: Metastatic disease evaluation. Malignant neoplasm of the lower third of the esophagus. FINDINGS: CHEST: MEDIASTINUM AND LYMPH NODES: Heart and pericardium are unremarkable. The central airways are clear. At least small volume hiatal hernia. Adjacent enlarged perigastric and paraesophageal lymph nodes measuring up to 1.4 cm (2:44). Associated adjacent paraesophageal nonenlarged prominent lymph nodes. Associated distal esophageal wall thickening. Mild thoracic aorta atherosclerotic plaque. At least 2-vessel coronary artery calcifications. No hilar or axillary lymphadenopathy. Heterogeneous bilateral thyroid glands with up to 1.8 cm right thyroid gland hypodense nodule and heterogeneous enhancing 2.1 cm left thyroid gland nodule.  LUNGS AND PLEURA: No pulmonary nodule or mass. No focal consolidation or pulmonary edema. No pleural effusion or pneumothorax. ABDOMEN AND PELVIS: LIVER: The liver is unremarkable. GALLBLADDER AND BILE DUCTS: Gallbladder is unremarkable. No biliary ductal dilatation. SPLEEN: No acute abnormality. PANCREAS: No acute abnormality. ADRENAL GLANDS: No acute abnormality. KIDNEYS, URETERS AND BLADDER: Renal cortical scarring of the left kidney. No renal mass. No hydroureteronephrosis bilaterally. No nephroureterolithiasis bilaterally. No perinephric or periureteral stranding. Urinary bladder is unremarkable. GI AND BOWEL: Stomach demonstrates no acute abnormality. No small or large bowel wall thickening or dilation. Colonic diverticulosis. Status post appendectomy. Likely peristalsis of the hepatic flexure (3:69). Similar finding of the transverse colon (3:78). Underdistention of the sigmoid colon with no pericolonic fat stranding. There is no bowel obstruction.  REPRODUCTIVE ORGANS: Markedly enlarged uterus with several uterine masses suggestive of fibroids. The uterus measures up to 14 x 7.5 cm. A T-shaped intrauterine device is noted within the expected region of the endometrium and may be low-lying. No definite adnexal mass identified with a likely degenerative peripherally calcified uterine fibroid extending along the left adnexa. PERITONEUM AND RETROPERITONEUM: No ascites. No free air. VASCULATURE: Aorta is normal in caliber. Mild abdominal aorta atherosclerotic plaque. ABDOMINAL AND PELVIS LYMPH NODES: No lymphadenopathy. BONES AND SOFT TISSUES: No suspicious lytic or blastic osseous lesion. No acute fracture. Bilateral nipple jewelry. There is a 2.5 x 1.5 cm fluid density lesion within the right breast (2:25). There is a 1.6 x 1.1 cm soft tissue density within the left breast (2:28). No focal soft tissue abnormality. IMPRESSION: 1. Small hiatal hernia with associated distal esophageal wall thickening. Enlarged perigastric and paraesophageal lymph nodes up to 1.4 cm. Findings consistent with known malignancy. 2. Left thyroid nodule 2.1 cm and right thyroid nodule 1.8 cm. Recommend non-emergent thyroid ultrasound. 3. Markedly enlarged uterus with multiple fibroids; intrauterine device appears low-lying but within the endometrium. Consider pelvic us  for further evaluation. 4. A 2.5 x 1.5 cm fluid density lesion within the right breast. A 1.6 x 1.1 cm soft tissue density within the left breast. Recommend correlation with physical exam and consideration of a diagnostic mammogram if clinically indicated. Electronically signed by: Kate Plummer MD 09/28/2024 06:03 PM EST RP Workstation: [id number]    Labs:  CBC: Recent Labs    09/30/24 1513  WBC 6.6  HGB 15.7*  HCT 47.2*  47.5*  PLT 346    COAGS: No results for input(s): INR, APTT in the last 8760 hours.  BMP: Recent Labs    09/28/24 1715 09/30/24 1513  NA  --  137  K  --  3.6  CL  --  99   CO2  --  26  GLUCOSE  --  111*  BUN  --  10  CALCIUM   --  9.6  CREATININE 1.10* 1.15*  GFRNONAA  --  56*    LIVER FUNCTION TESTS: Recent Labs    09/30/24 1513  BILITOT 0.3  AST 14*  ALT 7  ALKPHOS 78  PROT 8.0  ALBUMIN 3.9    TUMOR MARKERS: Recent Labs    09/30/24 1513  CEA 7.89*    Assessment and Plan: 53 y.o. female smoker with past medical history significant for ADD, hypertension, narcolepsy who presents now with recently diagnosed esophageal carcinoma.  She has poor venous access and  is scheduled today for Port-A-Cath placement to assist with treatment.Risks and benefits of image guided port-a-catheter placement was discussed with the patient including, but not limited to bleeding, infection, pneumothorax, or fibrin  sheath development and need for additional procedures.  All of the patient's questions were answered, patient is agreeable to proceed. Consent signed and in chart.    Thank you for allowing our service to participate in Toni Dougherty 's care.  Electronically Signed: D. Franky Rakers, PA-C   10/08/2024, 1:59 PM      I spent a total of  20 minutes   in face to face in clinical consultation, greater than 50% of which was counseling/coordinating care for port a cath placement

## 2024-10-09 ENCOUNTER — Other Ambulatory Visit (HOSPITAL_BASED_OUTPATIENT_CLINIC_OR_DEPARTMENT_OTHER): Payer: Self-pay

## 2024-10-09 ENCOUNTER — Ambulatory Visit (HOSPITAL_COMMUNITY)
Admission: RE | Admit: 2024-10-09 | Discharge: 2024-10-09 | Disposition: A | Discharge status: Home / Self Care | Source: Ambulatory Visit | Attending: Hematology | Admitting: Hematology

## 2024-10-09 ENCOUNTER — Other Ambulatory Visit: Payer: Self-pay

## 2024-10-09 ENCOUNTER — Encounter (HOSPITAL_COMMUNITY): Payer: Self-pay

## 2024-10-09 DIAGNOSIS — Z79899 Other long term (current) drug therapy: Secondary | ICD-10-CM | POA: Diagnosis not present

## 2024-10-09 DIAGNOSIS — F17210 Nicotine dependence, cigarettes, uncomplicated: Secondary | ICD-10-CM | POA: Insufficient documentation

## 2024-10-09 DIAGNOSIS — G47419 Narcolepsy without cataplexy: Secondary | ICD-10-CM | POA: Diagnosis not present

## 2024-10-09 DIAGNOSIS — C155 Malignant neoplasm of lower third of esophagus: Secondary | ICD-10-CM

## 2024-10-09 DIAGNOSIS — C159 Malignant neoplasm of esophagus, unspecified: Secondary | ICD-10-CM | POA: Diagnosis not present

## 2024-10-09 DIAGNOSIS — I10 Essential (primary) hypertension: Secondary | ICD-10-CM | POA: Insufficient documentation

## 2024-10-09 HISTORY — PX: IR IMAGING GUIDED PORT INSERTION: IMG5740

## 2024-10-09 LAB — POCT PREGNANCY, URINE: Preg Test, Ur: NEGATIVE

## 2024-10-09 MED ORDER — FENTANYL CITRATE (PF) 100 MCG/2ML IJ SOLN
INTRAMUSCULAR | Status: DC | PRN
  Administered 2024-10-09 (×2): 50 ug via INTRAVENOUS

## 2024-10-09 MED ORDER — LIDOCAINE-EPINEPHRINE 1 %-1:100000 IJ SOLN
20.0000 mL | Freq: Once | INTRAMUSCULAR | Status: AC
  Administered 2024-10-09: 20 mL via INTRADERMAL

## 2024-10-09 MED ORDER — HEPARIN SOD (PORK) LOCK FLUSH 100 UNIT/ML IV SOLN
INTRAVENOUS | Status: AC
  Filled 2024-10-09: qty 5

## 2024-10-09 MED ORDER — HEPARIN SOD (PORK) LOCK FLUSH 100 UNIT/ML IV SOLN
500.0000 [IU] | Freq: Once | INTRAVENOUS | Status: AC
  Administered 2024-10-09: 500 [IU] via INTRAVENOUS

## 2024-10-09 MED ORDER — MIDAZOLAM HCL (PF) 2 MG/2ML IJ SOLN
INTRAMUSCULAR | Status: DC | PRN
  Administered 2024-10-09 (×3): 1 mg via INTRAVENOUS

## 2024-10-09 MED ORDER — MIDAZOLAM HCL 2 MG/2ML IJ SOLN
INTRAMUSCULAR | Status: AC
  Filled 2024-10-09: qty 2

## 2024-10-09 MED ORDER — SODIUM CHLORIDE 0.9 % IV SOLN: INTRAVENOUS | Status: DC

## 2024-10-09 MED ORDER — FENTANYL CITRATE (PF) 100 MCG/2ML IJ SOLN
INTRAMUSCULAR | Status: AC
  Filled 2024-10-09: qty 2

## 2024-10-09 MED ORDER — LIDOCAINE-EPINEPHRINE 1 %-1:100000 IJ SOLN
INTRAMUSCULAR | Status: AC
Start: 2024-10-09 — End: 2024-10-09
  Filled 2024-10-09: qty 1

## 2024-10-09 NOTE — Procedures (Signed)
 Interventional Radiology Procedure Note  Procedure: Single Lumen Power Port Placement    Access:  Right internal jugular vein  Findings: Catheter tip positioned at cavoatrial junction. Port is ready for immediate use.   Complications: None  EBL: < 10 mL  Recommendations:  - Ok to shower in 24 hours - Do not submerge for 7 days - Routine line care    Ester Sides, MD

## 2024-10-09 NOTE — Discharge Instructions (Signed)
 Please call Interventional Radiology clinic [phone] with any questions or concerns.  You may remove your dressing and shower tomorrow.  After the procedure, it is common to have: Discomfort at the port insertion site. Bruising on the skin over the port. This should improve over 3-4 days  Follow these instructions at home:  Medication: Do not use Aspirin or ibuprofen products, such as Advil or Motrin, as it may increase bleeding.  You may resume your usual medications as ordered by your doctor. If your doctor prescribed antibiotics, take them as directed. Do not stop taking them just because you feel better. You need to take the full course of antibiotics.  Eating and drinking: Drink plenty of liquids to keep your urine pale yellow You can resume your regular diet as directed by your doctor   Care of the procedure site Follow instructions from your health care provider about how to take care of your port insertion site. Make sure you: After your port is placed, you will get a manufacturer's information card. The card has information about your port. Keep this card with you at all times Make sure to remember what type of port you have Take care of the port as told by your health care provider DO NOT use EMLA cream for 2 weeks after port placement -the cream will remove the surgical glue on your incision DO NOT use any lotions, creams, or ointments on incision for 2 weeks. This will remove the surgical glue on your incision Wash your hands with soap and water before and after you change your bandage (dressing). If soap and water are not available, use hand sanitizer Change your dressing as told by your health care provider Leave skin glue, or adhesive strips in place. These skin closures may need to stay in place for 2 weeks or longer Check your port insertion site every day for signs of infection. Check for: Redness, swelling, or pain Fluid or blood Warmth Pus or a bad  smell  Activity Return to your normal activities as told by your health care provider. Ask your health care provider what activities are safe for you Do not lift anything that is heavier than 10 lb (4.5 kg), or the limit that you are told, until your health care provider says that it is safe Do not take baths, swim, or use a hot tub until your health care provider approves. Take showers only. Keep all follow-up visits as told by your doctor  Contact a health care provider if: You cannot flush your port with saline as directed, or you cannot draw blood from the port You have a fever or chills You have redness, swelling, or pain around your port insertion site You have fluid or blood coming from your port insertion site Your port insertion site feels warm to the touch You have pus or a bad smell coming from the port insertion site  Get help right away if: You have chest pain or shortness of breath You have bleeding from your port that you cannot control  Moderate Conscious Sedation-Care After  This sheet gives you information about how to care for yourself after your procedure. Your health care provider may also give you more specific instructions. If you have problems or questions, contact your health care provider.  After the procedure, it is common to have: Sleepiness for several hours. Impaired judgment for several hours. Difficulty with balance. Vomiting if you eat too soon.  Follow these instructions at home:  Rest. Do not  participate in activities where you could fall or become injured. Do not drive or use machinery. Do not drink alcohol. Do not take sleeping pills or medicines that cause drowsiness. Do not make important decisions or sign legal documents. Do not take care of children on your own.  Eating and drinking Follow the diet recommended by your health care provider. Drink enough fluid to keep your urine pale yellow. If you vomit: Drink water, juice, or soup  when you can drink without vomiting. Make sure you have little or no nausea before eating solid foods.  General instructions Take over-the-counter and prescription medicines only as told by your health care provider. Have a responsible adult stay with you for the time you are told. It is important to have someone help care for you until you are awake and alert. Do not smoke. Keep all follow-up visits as told by your health care provider. This is important.  Contact a health care provider if: You are still sleepy or having trouble with balance after 24 hours. You feel light-headed. You keep feeling nauseous or you keep vomiting. You develop a rash. You have a fever. You have redness or swelling around the IV site.  Get help right away if: You have trouble breathing. You have new-onset confusion at home.  This information is not intended to replace advice given to you by your health care provider. Make sure you discuss any questions you have with your healthcare provider.

## 2024-10-09 NOTE — Progress Notes (Signed)
 Pharmacist Chemotherapy Monitoring - Initial Assessment    Anticipated start date: 10/17/24    The following has been reviewed per standard work regarding the patient's treatment regimen: The patient's diagnosis, treatment plan and drug doses, and organ/hematologic function Lab orders and baseline tests specific to treatment regimen  The treatment plan start date, drug sequencing, and pre-medications Prior authorization status  Patient's documented medication list, including drug-drug interaction screen and prescriptions for anti-emetics and supportive care specific to the treatment regimen The drug concentrations, fluid compatibility, administration routes, and timing of the medications to be used The patient's access for treatment and lifetime cumulative dose history, if applicable  The patient's medication allergies and previous infusion related reactions, if applicable   Changes made to treatment plan:  treatment plan date  Follow up needed:  Pending authorization for treatment for Fulphila   Bridgett Leotis Helling, RPH, BCPS, BCOP 10/09/2024   3:16 PM

## 2024-10-10 ENCOUNTER — Other Ambulatory Visit: Payer: Self-pay

## 2024-10-11 ENCOUNTER — Inpatient Hospital Stay: Admitting: Licensed Clinical Social Worker

## 2024-10-11 ENCOUNTER — Inpatient Hospital Stay

## 2024-10-11 ENCOUNTER — Ambulatory Visit (HOSPITAL_COMMUNITY)
Admission: RE | Admit: 2024-10-11 | Discharge: 2024-10-11 | Disposition: A | Discharge status: Home / Self Care | Source: Ambulatory Visit | Attending: Hematology | Admitting: Hematology

## 2024-10-11 DIAGNOSIS — I10 Essential (primary) hypertension: Secondary | ICD-10-CM | POA: Diagnosis not present

## 2024-10-11 DIAGNOSIS — K5900 Constipation, unspecified: Secondary | ICD-10-CM | POA: Diagnosis not present

## 2024-10-11 DIAGNOSIS — F17210 Nicotine dependence, cigarettes, uncomplicated: Secondary | ICD-10-CM | POA: Diagnosis not present

## 2024-10-11 DIAGNOSIS — K222 Esophageal obstruction: Secondary | ICD-10-CM | POA: Diagnosis not present

## 2024-10-11 DIAGNOSIS — F909 Attention-deficit hyperactivity disorder, unspecified type: Secondary | ICD-10-CM | POA: Diagnosis not present

## 2024-10-11 DIAGNOSIS — Z7952 Long term (current) use of systemic steroids: Secondary | ICD-10-CM | POA: Diagnosis not present

## 2024-10-11 DIAGNOSIS — C155 Malignant neoplasm of lower third of esophagus: Secondary | ICD-10-CM | POA: Insufficient documentation

## 2024-10-11 DIAGNOSIS — Z79899 Other long term (current) drug therapy: Secondary | ICD-10-CM | POA: Diagnosis not present

## 2024-10-11 LAB — GLUCOSE, CAPILLARY: Glucose-Capillary: 111 mg/dL — ABNORMAL HIGH (ref 70–99)

## 2024-10-11 MED ORDER — FLUDEOXYGLUCOSE F - 18 (FDG) INJECTION
8.8700 | Freq: Once | INTRAVENOUS | Status: AC
  Administered 2024-10-11: 8.87 via INTRAVENOUS

## 2024-10-11 NOTE — Progress Notes (Signed)
 [facility] CSW Progress Note  Visual Merchandiser met with patient and her friend to follow-up on financial concerns.    Interventions: CSW went through the financial resources available at this time.  Pt will apply for SNAP benefits and if awarded will apply for the [employer].  Pt to provide CSW with a bill to submit to the [employer] for their Dec 1st grant.  Referral to be sent to [facility] on behalf of pt after the first of the year when funds are replenished.  Referral sent to [facility] for transportation.  All information also sent to pt in an email w/ the link to [employer] to explore grants to assist w/ co-pays.  Pt made aware of referral process to the [facility] to apply for SSDI if appropriate.       Follow Up Plan:  Patient will contact CSW with any support or resource needs    Devere JONELLE Manna, LCSW Clinical Social Worker [facility]

## 2024-10-14 ENCOUNTER — Other Ambulatory Visit (HOSPITAL_BASED_OUTPATIENT_CLINIC_OR_DEPARTMENT_OTHER): Payer: Self-pay

## 2024-10-14 ENCOUNTER — Encounter: Payer: Self-pay | Admitting: Hematology

## 2024-10-14 MED ORDER — NEBIVOLOL HCL 5 MG PO TABS
10.0000 mg | ORAL_TABLET | Freq: Every day | ORAL | 0 refills | Status: DC
  Filled 2024-10-14: qty 60, 30d supply, fill #0

## 2024-10-15 ENCOUNTER — Other Ambulatory Visit (HOSPITAL_BASED_OUTPATIENT_CLINIC_OR_DEPARTMENT_OTHER): Payer: Self-pay

## 2024-10-15 ENCOUNTER — Encounter: Payer: Self-pay | Admitting: Hematology

## 2024-10-15 ENCOUNTER — Inpatient Hospital Stay

## 2024-10-15 ENCOUNTER — Other Ambulatory Visit (HOSPITAL_COMMUNITY): Payer: Self-pay

## 2024-10-15 MED ORDER — AMPHETAMINE-DEXTROAMPHETAMINE 30 MG PO TABS
30.0000 mg | ORAL_TABLET | Freq: Two times a day (BID) | ORAL | 0 refills | Status: AC
  Filled 2024-11-26: qty 60, 30d supply, fill #0

## 2024-10-16 ENCOUNTER — Inpatient Hospital Stay: Admitting: Hematology

## 2024-10-16 ENCOUNTER — Inpatient Hospital Stay

## 2024-10-16 ENCOUNTER — Other Ambulatory Visit (HOSPITAL_BASED_OUTPATIENT_CLINIC_OR_DEPARTMENT_OTHER): Payer: Self-pay

## 2024-10-16 ENCOUNTER — Inpatient Hospital Stay: Attending: Hematology

## 2024-10-16 VITALS — BP 148/98 | HR 79 | Temp 97.7°F | Resp 19 | Ht 72.0 in | Wt 191.7 lb

## 2024-10-16 DIAGNOSIS — R634 Abnormal weight loss: Secondary | ICD-10-CM | POA: Diagnosis not present

## 2024-10-16 DIAGNOSIS — C155 Malignant neoplasm of lower third of esophagus: Secondary | ICD-10-CM

## 2024-10-16 DIAGNOSIS — K521 Toxic gastroenteritis and colitis: Secondary | ICD-10-CM | POA: Diagnosis not present

## 2024-10-16 DIAGNOSIS — I10 Essential (primary) hypertension: Secondary | ICD-10-CM | POA: Diagnosis not present

## 2024-10-16 DIAGNOSIS — Z5111 Encounter for antineoplastic chemotherapy: Secondary | ICD-10-CM | POA: Insufficient documentation

## 2024-10-16 DIAGNOSIS — N179 Acute kidney failure, unspecified: Secondary | ICD-10-CM | POA: Insufficient documentation

## 2024-10-16 DIAGNOSIS — L659 Nonscarring hair loss, unspecified: Secondary | ICD-10-CM | POA: Diagnosis not present

## 2024-10-16 DIAGNOSIS — Z79899 Other long term (current) drug therapy: Secondary | ICD-10-CM | POA: Insufficient documentation

## 2024-10-16 DIAGNOSIS — Z7952 Long term (current) use of systemic steroids: Secondary | ICD-10-CM | POA: Diagnosis not present

## 2024-10-16 DIAGNOSIS — G47419 Narcolepsy without cataplexy: Secondary | ICD-10-CM | POA: Diagnosis not present

## 2024-10-16 DIAGNOSIS — K222 Esophageal obstruction: Secondary | ICD-10-CM | POA: Diagnosis not present

## 2024-10-16 DIAGNOSIS — C771 Secondary and unspecified malignant neoplasm of intrathoracic lymph nodes: Secondary | ICD-10-CM | POA: Insufficient documentation

## 2024-10-16 DIAGNOSIS — Z5112 Encounter for antineoplastic immunotherapy: Secondary | ICD-10-CM | POA: Diagnosis not present

## 2024-10-16 DIAGNOSIS — F988 Other specified behavioral and emotional disorders with onset usually occurring in childhood and adolescence: Secondary | ICD-10-CM | POA: Insufficient documentation

## 2024-10-16 LAB — CBC WITH DIFFERENTIAL (CANCER CENTER ONLY)
Abs Immature Granulocytes: 0.02 K/uL (ref 0.00–0.07)
Basophils Absolute: 0.1 K/uL (ref 0.0–0.1)
Basophils Relative: 1 %
Eosinophils Absolute: 0.1 K/uL (ref 0.0–0.5)
Eosinophils Relative: 1 %
HCT: 38.6 % (ref 36.0–46.0)
Hemoglobin: 12.8 g/dL (ref 12.0–15.0)
Immature Granulocytes: 0 %
Lymphocytes Relative: 31 %
Lymphs Abs: 2.7 K/uL (ref 0.7–4.0)
MCH: 30.4 pg (ref 26.0–34.0)
MCHC: 33.2 g/dL (ref 30.0–36.0)
MCV: 91.7 fL (ref 80.0–100.0)
Monocytes Absolute: 0.5 K/uL (ref 0.1–1.0)
Monocytes Relative: 5 %
Neutro Abs: 5.3 K/uL (ref 1.7–7.7)
Neutrophils Relative %: 62 %
Platelet Count: 261 K/uL (ref 150–400)
RBC: 4.21 MIL/uL (ref 3.87–5.11)
RDW: 13.9 % (ref 11.5–15.5)
WBC Count: 8.6 K/uL (ref 4.0–10.5)
nRBC: 0 % (ref 0.0–0.2)

## 2024-10-16 LAB — CMP (CANCER CENTER ONLY)
ALT: 7 U/L (ref 0–44)
AST: 13 U/L — ABNORMAL LOW (ref 15–41)
Albumin: 3.8 g/dL (ref 3.5–5.0)
Alkaline Phosphatase: 76 U/L (ref 38–126)
Anion gap: 10 (ref 5–15)
BUN: 10 mg/dL (ref 6–20)
CO2: 25 mmol/L (ref 22–32)
Calcium: 8.7 mg/dL — ABNORMAL LOW (ref 8.9–10.3)
Chloride: 104 mmol/L (ref 98–111)
Creatinine: 1.05 mg/dL — ABNORMAL HIGH (ref 0.44–1.00)
GFR, Estimated: >60 mL/min (ref >60)
Glucose, Bld: 104 mg/dL — ABNORMAL HIGH (ref 70–99)
Potassium: 3.8 mmol/L (ref 3.5–5.1)
Sodium: 139 mmol/L (ref 135–145)
Total Bilirubin: 0.4 mg/dL (ref 0.0–1.2)
Total Protein: 7 g/dL (ref 6.5–8.1)

## 2024-10-16 LAB — TSH: TSH: 2.8 u[IU]/mL (ref 0.350–4.500)

## 2024-10-16 LAB — PREGNANCY, URINE: Preg Test, Ur: NEGATIVE

## 2024-10-16 NOTE — Assessment & Plan Note (Signed)
 cT3N2M0, MMR normal, Her2 (-), PD-L1 CPS 100% - Patient presented with progressive dysphagia and a 50 pound weight loss -EGD showed a large ulcerated, partially obstructed mass in the distal esophagus, extending into GE junction and gastric cardia, biopsy showed poorly differentiated adenocarcinoma. -PET scan was denied by insurance, CT showed enlarged mediastinal adenopathy, US  staging T3 N2 -I recommend neoadjuvant chemotherapy FLOT and durvalumab, she will start on 10/17/2024

## 2024-10-16 NOTE — Progress Notes (Signed)
 [facility]   [phone] [phone]   Clinic Follow up Note   Patient Care Team: Sheldon Netter, [name] as PCP - General (Physician Assistant) Ardis Evalene CROME, RN as Oncology Nurse Navigator  Date of Service:  10/16/2024  CHIEF COMPLAINT: f/u of esophageal cancer  CURRENT THERAPY:  Pending neoadjuvant chemotherapy FLOT and durvalumab  Oncology History   Esophageal cancer (HCC) cT3N2M0, MMR normal, Her2 (-), PD-L1 CPS 100% - Patient presented with progressive dysphagia and a 50 pound weight loss -EGD showed a large ulcerated, partially obstructed mass in the distal esophagus, extending into GE junction and gastric cardia, biopsy showed poorly differentiated adenocarcinoma. 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 PET scan shows primary tumor in the esophagus and mediastinal lymph node metastasis.  PET scan showed a hypermetabolic lesion in the right gluteal also, no palpable mass in the area.  I recommend ultrasound evaluation and biopsy if there is a mass on the ultrasound.  No other metastasis in the lung or liver.  -If biopsy confirms metastatic disease, then the goal of therapy is palliative, and surgery would not be recommended. -Treatment plan includes chemotherapy and immunotherapy. Chemotherapy is administered every two weeks, and immunotherapy every four weeks. Treatment is intensive and requires a full day for infusion and removal of the urethane pump. Surgery is considered post-chemotherapy, contingent on further evaluation of the suspicious hip lesion. - Proceed with chemotherapy and immunotherapy as scheduled. - Scheduled chemotherapy class for today. - Scheduled follow-up PET scan in three  months. - Postponed thoracic surgery consultation until further evaluation of the right gluteal lesion.  Suspicious right posterior hip/gluteal lesion, possible metastatic disease Suspicious lesion in the right posterior hip/gluteal area identified on PET scan. No palpable mass on physical examination. Differential diagnosis includes metastatic disease from esophageal cancer. Recent trauma to the area may have caused a hematoma. Further imaging and potential biopsy are needed to confirm diagnosis. If biopsy confirms metastasis, surgery may not be pursued, and treatment will focus on chemotherapy and possible radiation. - Ordered ultrasound of the right posterior hip/gluteal area. - Will consider fine needle aspiration biopsy if ultrasound shows abnormal findings. - Postponed thoracic surgery consultation until further evaluation of the hip lesion.  Plan - PET scan images and findings reviewed and discussed with patient and her friend - I recommend ultrasound evaluation biopsy of the hypermetabolic right gluteal lesion - Will postpone her consult with thoracic surgeon Dr. Shyrl - Will proceed for cycle chemotherapy FLOT and durvalumab tomorrow with pump DC and G-CSF injection on day 2 -f/u in 2 weeks before cycle 2 chemo.   SUMMARY OF ONCOLOGIC HISTORY: Oncology History  Esophageal cancer (HCC)  09/18/2024 Cancer Staging   Staging form: Esophagus - Adenocarcinoma, AJCC 8th Edition - Clinical stage from 09/18/2024: Stage IVA (cT3, cN2, cM0, G3) - Signed by Lanny Callander, MD on 10/03/2024 Histologic grading system: 3 grade system   10/03/2024 Initial Diagnosis   Esophageal cancer (HCC)   10/17/2024 -  Chemotherapy   Patient is on Treatment Plan : GASTROESOPHAGEAL Durvalumab q28d + FLOT q14d / Durvalumab q28d        Discussed the use of AI scribe software for clinical note transcription with the patient, who gave verbal consent to proceed.  History of Present Illness Toni Dougherty is a 53  year old female with esophageal cancer who presents for follow-up.  She is scheduled to start treatment with three chemotherapy drugs every two weeks and one immunotherapy drug every four weeks. A recent PET scan showed a primary esophageal tumor with a few positive lymph nodes and an additional area on the right posterior thigh/gluteal region. She has no pain, skin lesions, or palpable lumps in that area. There are no concerning findings for metastasis in the lungs or liver. She is using prescribed nausea medication starting two days after each chemotherapy session and is asking about work accommodations during treatment.     All other systems were reviewed with the patient and are negative.  MEDICAL HISTORY:  Past Medical History:  Diagnosis Date   ADD (attention deficit disorder)    HTN (hypertension)    Narcolepsy     SURGICAL HISTORY: Past Surgical History:  Procedure Laterality Date   APPENDECTOMY     ESOPHAGOGASTRODUODENOSCOPY N/A 10/02/2024   Procedure: EGD (ESOPHAGOGASTRODUODENOSCOPY);  Surgeon: Burnette Fallow, MD;  Location: [facility];  Service: Gastroenterology;  Laterality: N/A;   EUS N/A 10/02/2024   Procedure: ULTRASOUND, UPPER GI TRACT, ENDOSCOPIC;  Surgeon: Burnette Fallow, MD;  Location: [facility];  Service: Gastroenterology;  Laterality: N/A;   IR IMAGING GUIDED PORT INSERTION  10/09/2024   rotate cuff Right 2012    I have reviewed the social history and family history with the patient and they are unchanged from previous note.  ALLERGIES:  is allergic to erythromycin and penicillins.  MEDICATIONS:  Current Outpatient Medications  Medication Sig Dispense Refill   amLODipine  (NORVASC ) 5 MG tablet Take 1 tablet (5 mg total) by mouth daily. 90 tablet 1   amphetamine -dextroamphetamine  (ADDERALL) 30 MG tablet Take 1 tablet by mouth 2 (two) times daily. 60 tablet 0   amphetamine -dextroamphetamine  (ADDERALL) 30 MG tablet Take 1 tablet by mouth 2 (two) times  daily. 60 tablet 0   amphetamine -dextroamphetamine  (ADDERALL) 30 MG tablet Take 1 tablet by mouth 2 (two) times daily. 60 tablet 0   amphetamine -dextroamphetamine  (ADDERALL) 30 MG tablet 1 tablet Orally Twice a day (Fill date 10/15/2024) 60 tablet 0   dexamethasone  (DECADRON ) 4 MG tablet Take 2 tablets (8 mg total) by mouth daily. Start the day after chemotherapy for 2 days. Take with food. 30 tablet 1   lidocaine -prilocaine  (EMLA ) cream Apply to affected area once 30 g 3   nebivolol  (BYSTOLIC ) 5 MG tablet Take 1 tablet (5 mg total) by mouth daily. 30 tablet 0   nebivolol  (BYSTOLIC ) 5 MG tablet Take 2 tablets (10 mg total) by mouth daily. 60 tablet 0   [START ON 10/21/2024] nicotine  (NICODERM CQ  - DOSED IN MG/24 HR) 7 mg/24hr patch Place 1 patch (7 mg total) onto the skin daily. 28 patch 0   ondansetron  (ZOFRAN ) 8 MG tablet Take 1 tablet (8 mg total) by mouth every 8 (eight) hours as needed for nausea or vomiting. Start on the third day after chemotherapy. 30 tablet 1   prochlorperazine  (COMPAZINE ) 10 MG tablet Take 1 tablet (10 mg total) by mouth every 6 (six) hours as needed for nausea or vomiting. 30 tablet 1   rosuvastatin  (CRESTOR ) 20 MG tablet Take 1 tablet (20 mg total) by mouth daily. 90 tablet 1   sucralfate  (CARAFATE ) 1 g tablet Take 1 tablet (1 g total) by mouth 2 (two) times daily on an empty stomach. 60 tablet 1   triamcinolone  cream (KENALOG ) 0.1 % Apply to affected area(s) twice  daily as needed 30 g 1   No current facility-administered medications for this visit.    PHYSICAL EXAMINATION: ECOG PERFORMANCE STATUS: 1 - Symptomatic but completely ambulatory  Vitals:   10/16/24 1317 10/16/24 1318  BP: (!) 153/97 (!) 148/98  Pulse: 79   Resp: 19   Temp: 97.7 F (36.5 C)   SpO2: 99%    Wt Readings from Last 3 Encounters:  10/16/24 191 lb 11.2 oz (87 kg)  10/09/24 179 lb 14.3 oz (81.6 kg)  10/02/24 180 lb (81.6 kg)     GENERAL:alert, no distress and comfortable SKIN: skin  color, texture, turgor are normal, no rashes or significant lesions EYES: normal, Conjunctiva are pink and non-injected, sclera clear NECK: supple, thyroid normal size, non-tender, without nodularity LYMPH:  no palpable lymphadenopathy in the cervical, axillary  LUNGS: clear to auscultation and percussion with normal breathing effort HEART: regular rate & rhythm and no murmurs and no lower extremity edema ABDOMEN:abdomen soft, non-tender and normal bowel sounds Musculoskeletal:no cyanosis of digits and no clubbing. No palpable masses in the right buttock area. NEURO: alert & oriented x 3 with fluent speech, no focal motor/sensory deficits  Physical Exam   LABORATORY DATA:  I have reviewed the data as listed    Latest Ref Rng & Units 10/16/2024   12:44 PM 09/30/2024    3:13 PM 06/28/2023    4:18 PM  CBC  WBC 4.0 - 10.5 K/uL 8.6  6.6  9.2   Hemoglobin 12.0 - 15.0 g/dL 87.1  84.2  84.7   Hematocrit 36.0 - 46.0 % 38.6  47.5    47.2  46.6   Platelets 150 - 400 K/uL 261  346  294.0         Latest Ref Rng & Units 10/16/2024   12:44 PM 09/30/2024    3:13 PM 09/28/2024    5:15 PM  CMP  Glucose 70 - 99 mg/dL 895  888    BUN 6 - 20 mg/dL 10  10    Creatinine 9.55 - 1.00 mg/dL 8.94  8.84  8.89   Sodium 135 - 145 mmol/L 139  137    Potassium 3.5 - 5.1 mmol/L 3.8  3.6    Chloride 98 - 111 mmol/L 104  99    CO2 22 - 32 mmol/L 25  26    Calcium  8.9 - 10.3 mg/dL 8.7  9.6    Total Protein 6.5 - 8.1 g/dL 7.0  8.0    Total Bilirubin 0.0 - 1.2 mg/dL 0.4  0.3    Alkaline Phos 38 - 126 U/L 76  78    AST 15 - 41 U/L 13  14    ALT 0 - 44 U/L 7  7        RADIOGRAPHIC STUDIES: I have personally reviewed the radiological images as listed and agreed with the findings in the report. No results found.    Orders Placed This Encounter  Procedures   US  CORE BIOPSY (SOFT TISSUE)    Standing Status:   Future    Expected Date:   10/17/2024    Expiration Date:   10/16/2025    Lab orders requested  (DO NOT place separate lab orders, these will be automatically ordered during procedure specimen collection)::   Surgical Pathology    Reason for Exam (SYMPTOM  OR DIAGNOSIS REQUIRED):   hypermetabolic lesion in right gluteus musculature on PET scan    Preferred location?:   [facility]   All questions  were answered. The patient knows to call the clinic with any problems, questions or concerns. No barriers to learning was detected. The total time spent in the appointment was 40 minutes, including review of chart and various tests results, discussions about plan of care and coordination of care plan     Toni Mattock, MD 10/16/2024

## 2024-10-16 NOTE — Progress Notes (Signed)
 Sent Secure Chat message to Melissa Xayasine in [place] Scheduling to assist with getting biopsy scheduled for pt.  Eleanor is working on getting it scheduled.  Case is currently in review.

## 2024-10-17 ENCOUNTER — Inpatient Hospital Stay

## 2024-10-17 ENCOUNTER — Inpatient Hospital Stay: Admitting: Licensed Clinical Social Worker

## 2024-10-17 ENCOUNTER — Inpatient Hospital Stay: Admitting: Dietician

## 2024-10-17 ENCOUNTER — Encounter (HOSPITAL_COMMUNITY): Payer: Self-pay

## 2024-10-17 ENCOUNTER — Other Ambulatory Visit: Payer: Self-pay

## 2024-10-17 ENCOUNTER — Encounter: Payer: Self-pay | Admitting: Hematology

## 2024-10-17 VITALS — BP 127/95 | HR 63 | Temp 98.5°F | Resp 16

## 2024-10-17 DIAGNOSIS — C155 Malignant neoplasm of lower third of esophagus: Secondary | ICD-10-CM

## 2024-10-17 LAB — T4: T4, Total: 7.8 ug/dL (ref 4.5–12.0)

## 2024-10-17 MED ORDER — LEUCOVORIN CALCIUM INJECTION 350 MG
200.0000 mg/m2 | Freq: Once | INTRAVENOUS | Status: AC
  Administered 2024-10-17: 408 mg via INTRAVENOUS
  Filled 2024-10-17: qty 20.4

## 2024-10-17 MED ORDER — DEXTROSE 5 % IV SOLN: INTRAVENOUS | Status: DC

## 2024-10-17 MED ORDER — OXALIPLATIN CHEMO INJECTION 100 MG/20ML
85.0000 mg/m2 | Freq: Once | INTRAVENOUS | Status: AC
  Administered 2024-10-17: 175 mg via INTRAVENOUS
  Filled 2024-10-17: qty 35

## 2024-10-17 MED ORDER — SODIUM CHLORIDE 0.9 % IV SOLN
50.0000 mg/m2 | Freq: Once | INTRAVENOUS | Status: AC
  Administered 2024-10-17: 102 mg via INTRAVENOUS
  Filled 2024-10-17: qty 10.2

## 2024-10-17 MED ORDER — PALONOSETRON HCL INJECTION 0.25 MG/5ML
0.2500 mg | Freq: Once | INTRAVENOUS | Status: AC
  Administered 2024-10-17: 0.25 mg via INTRAVENOUS
  Filled 2024-10-17: qty 5

## 2024-10-17 MED ORDER — SODIUM CHLORIDE 0.9 % IV SOLN
2600.0000 mg/m2 | INTRAVENOUS | Status: DC
  Administered 2024-10-17: 5000 mg via INTRAVENOUS
  Filled 2024-10-17: qty 100

## 2024-10-17 MED ORDER — SODIUM CHLORIDE 0.9 % IV SOLN
1500.0000 mg | Freq: Once | INTRAVENOUS | Status: AC
  Administered 2024-10-17: 1500 mg via INTRAVENOUS
  Filled 2024-10-17: qty 30

## 2024-10-17 MED ORDER — SODIUM CHLORIDE 0.9 % IV SOLN: INTRAVENOUS | Status: DC

## 2024-10-17 MED ORDER — SODIUM CHLORIDE 0.9% FLUSH: 10.0000 mL | INTRAVENOUS | Status: DC | PRN

## 2024-10-17 NOTE — Progress Notes (Unsigned)
 [facility] CSW Progress Note   Interventions: Letter drafted at request of patient for her employer to allow for accommodations regarding her duties due to treatment.      Follow Up Plan:  Patient will contact CSW with any support or resource needs    Devere JONELLE Manna, LCSW Clinical Social Worker [facility]

## 2024-10-17 NOTE — Progress Notes (Signed)
 Nutrition Follow-up:  Pt with malignant neoplasm of lower third of esophagus. Receiving chemotherapy with FLOT q14d + durvalumab  q28d (start 12/4). Patient is under the care of Dr. Lanny.   Met with patient in infusion. Today is her first treatment. Chicken noodle soup on table. Says this is disgusting, but relates this to just being tired of eating soup. Patient tried eating a piece of pizza this week. Unable to swallow and episodes of vomiting followed. Says that emesis is frothy and has multiple episodes. Patient reports drinking Ensure Original prior to visit. She likes this. Says it taste much better than the plus or complete.    Medications: reviewed   Labs: glucose 104, Cr 1.05  Anthropometrics: Wt 191 lb 11.2 oz  11/10 - 193 lb 11.2 oz   NUTRITION DIAGNOSIS: Unintended wt loss continues    INTERVENTION:  Discussed possible cold sensitivity side effects of oxaliplatin  - offered warm supplement ideas  Encourage high calorie high protein foods in full liquid/smooth textures - FLD handout with ideas provided Continue drinking Ensure Original as tolerated - samples provided along with samples of Boost VHC, Boost breeze, Ensure Clear, CIB packets    MONITORING, EVALUATION, GOAL: wt trends, intake   NEXT VISIT: Friday December 19 during infusion

## 2024-10-17 NOTE — Patient Instructions (Addendum)
 [facility] - A DEPT OF [facility]  Discharge Instructions: Thank you for choosing [facility] to provide your oncology and hematology care.   If you have a lab appointment with the Cancer Center, please go directly to the Cancer Center and check in at the registration area.   Wear comfortable clothing and clothing appropriate for easy access to any Portacath or PICC line.   We strive to give you quality time with your provider. You may need to reschedule your appointment if you arrive late (15 or more minutes).  Arriving late affects you and other patients whose appointments are after yours.  Also, if you miss three or more appointments without notifying the office, you may be dismissed from the clinic at the provider's discretion.      For prescription refill requests, have your pharmacy contact our office and allow 72 hours for refills to be completed.    Today you received the following chemotherapy and/or immunotherapy agents: Durvalumab, Docetaxel, Oxaliplatin, Leucovorin, Fluorouracil      To help prevent nausea and vomiting after your treatment, we encourage you to take your nausea medication as directed.  BELOW ARE SYMPTOMS THAT SHOULD BE REPORTED IMMEDIATELY: *FEVER GREATER THAN 100.4 F (38 C) OR HIGHER *CHILLS OR SWEATING *NAUSEA AND VOMITING THAT IS NOT CONTROLLED WITH YOUR NAUSEA MEDICATION *UNUSUAL SHORTNESS OF BREATH *UNUSUAL BRUISING OR BLEEDING *URINARY PROBLEMS (pain or burning when urinating, or frequent urination) *BOWEL PROBLEMS (unusual diarrhea, constipation, pain near the anus) TENDERNESS IN MOUTH AND THROAT WITH OR WITHOUT PRESENCE OF ULCERS (sore throat, sores in mouth, or a toothache) UNUSUAL RASH, SWELLING OR PAIN  UNUSUAL VAGINAL DISCHARGE OR ITCHING   Items with * indicate a potential emergency and should be followed up as soon as possible or go to the Emergency Department if any problems should occur.  Please  show the CHEMOTHERAPY ALERT CARD or IMMUNOTHERAPY ALERT CARD at check-in to the Emergency Department and triage nurse.  Should you have questions after your visit or need to cancel or reschedule your appointment, please contact [facility] - A DEPT OF [facility]  Dept: [phone]  and follow the prompts.  Office hours are 8:00 a.m. to 4:30 p.m. Monday - Friday. Please note that voicemails left after 4:00 p.m. may not be returned until the following business day.  We are closed weekends and major holidays. You have access to a nurse at all times for urgent questions. Please call the main number to the clinic Dept: [phone] and follow the prompts.   For any non-urgent questions, you may also contact your provider using MyChart. We now offer e-Visits for anyone 52 and older to request care online for non-urgent symptoms. For details visit [email or web address].   Also download the MyChart app! Go to the app store, search MyChart, open the app, select [place], and log in with your MyChart username and password.  Durvalumab Injection What is this medication? DURVALUMAB (dur VAL ue mab) treats some types of cancer. It works by helping your immune system slow or stop the spread of cancer cells. It is a monoclonal antibody. This medicine may be used for other purposes; ask your health care provider or pharmacist if you have questions. COMMON BRAND NAME(S): IMFINZI What should I tell my care team before I take this medication? They need to know if you have any of these conditions: Allogeneic stem cell transplant (uses someone  else's stem cells) Autoimmune diseases, such as Crohn disease, ulcerative colitis, lupus History of chest radiation Nervous system problems, such as Guillain-Barre syndrome, myasthenia gravis Organ transplant An unusual or allergic reaction to durvalumab, other medications, foods, dyes, or preservatives Pregnant or trying to get  pregnant Breast-feeding How should I use this medication? This medication is infused into a vein. It is given by your care team in a hospital or clinic setting. A special MedGuide will be given to you before each treatment. Be sure to read this information carefully each time. Talk to your care team about the use of this medication in children. Special care may be needed. Overdosage: If you think you have taken too much of this medicine contact a poison control center or emergency room at once. NOTE: This medicine is only for you. Do not share this medicine with others. What if I miss a dose? Keep appointments for follow-up doses. It is important not to miss your dose. Call your care team if you are unable to keep an appointment. What may interact with this medication? Interactions have not been studied. This list may not describe all possible interactions. Give your health care provider a list of all the medicines, herbs, non-prescription drugs, or dietary supplements you use. Also tell them if you smoke, drink alcohol, or use illegal drugs. Some items may interact with your medicine. What should I watch for while using this medication? Your condition will be monitored carefully while you are receiving this medication. You may need blood work while taking this medication. This medication may cause serious skin reactions. They can happen weeks to months after starting the medication. Contact your care team right away if you notice fevers or flu-like symptoms with a rash. The rash may be red or purple and then turn into blisters or peeling of the skin. You may also notice a red rash with swelling of the face, lips, or lymph nodes in your neck or under your arms. Tell your care team right away if you have any change in your eyesight. Talk to your care team if you may be pregnant. Serious birth defects can occur if you take this medication during pregnancy and for 3 months after the last dose. You will  need a negative pregnancy test before starting this medication. Contraception is recommended while taking this medication and for 3 months after the last dose. Your care team can help you find the option that works for you. Do not breastfeed while taking this medication and for 3 months after the last dose. What side effects may I notice from receiving this medication? Side effects that you should report to your care team as soon as possible: Allergic reactions--skin rash, itching, hives, swelling of the face, lips, tongue, or throat Dry cough, shortness of breath or trouble breathing Eye pain, redness, irritation, or discharge with blurry or decreased vision Heart muscle inflammation--unusual weakness or fatigue, shortness of breath, chest pain, fast or irregular heartbeat, dizziness, swelling of the ankles, feet, or hands Hormone gland problems--headache, sensitivity to light, unusual weakness or fatigue, dizziness, fast or irregular heartbeat, increased sensitivity to cold or heat, excessive sweating, constipation, hair loss, increased thirst or amount of urine, tremors or shaking, irritability Infusion reactions--chest pain, shortness of breath or trouble breathing, feeling faint or lightheaded Kidney injury (glomerulonephritis)--decrease in the amount of urine, red or dark brown urine, foamy or bubbly urine, swelling of the ankles, hands, or feet Liver injury--right upper belly pain, loss of appetite, nausea, light-colored  stool, dark yellow or brown urine, yellowing skin or eyes, unusual weakness or fatigue Pain, tingling, or numbness in the hands or feet, muscle weakness, change in vision, confusion or trouble speaking, loss of balance or coordination, trouble walking, seizures Rash, fever, and swollen lymph nodes Redness, blistering, peeling, or loosening of the skin, including inside the mouth Sudden or severe stomach pain, bloody diarrhea, fever, nausea, vomiting Side effects that usually  do not require medical attention (report these to your care team if they continue or are bothersome): Bone, joint, or muscle pain Diarrhea Fatigue Loss of appetite Nausea Skin rash This list may not describe all possible side effects. Call your doctor for medical advice about side effects. You may report side effects to FDA at 1-800-FDA-1088. Where should I keep my medication? This medication is given in a hospital or clinic. It will not be stored at home. NOTE: This sheet is a summary. It may not cover all possible information. If you have questions about this medicine, talk to your doctor, pharmacist, or health care provider.  2024 Elsevier/Gold Standard (2022-03-15 00:00:00) Docetaxel Injection What is this medication? DOCETAXEL (doe se TAX el) treats some types of cancer. It works by slowing down the growth of cancer cells. This medicine may be used for other purposes; ask your health care provider or pharmacist if you have questions. COMMON BRAND NAME(S): BEIZRAY, Docefrez, Docivyx, Taxotere What should I tell my care team before I take this medication? They need to know if you have any of these conditions: Kidney disease Liver disease Low white blood cell levels Tingling of the fingers or toes or other nerve disorder An unusual or allergic reaction to docetaxel, polysorbate 80, other medications, foods, dyes, or preservatives Pregnant or trying to get pregnant Breast-feeding How should I use this medication? This medication is injected into a vein. It is given by your care team in a hospital or clinic setting. Talk to your care team about the use of this medication in children. Special care may be needed. Overdosage: If you think you have taken too much of this medicine contact a poison control center or emergency room at once. NOTE: This medicine is only for you. Do not share this medicine with others. What if I miss a dose? Keep appointments for follow-up doses. It is  important not to miss your dose. Call your care team if you are unable to keep an appointment. What may interact with this medication? Do not take this medication with any of the following: Live virus vaccines This medication may also interact with the following: Certain antibiotics, such as clarithromycin, telithromycin Certain antivirals for HIV or hepatitis Certain medications for fungal infections, such as itraconazole, ketoconazole, voriconazole Grapefruit juice Nefazodone Supplements, such as St. John's wort This list may not describe all possible interactions. Give your health care provider a list of all the medicines, herbs, non-prescription drugs, or dietary supplements you use. Also tell them if you smoke, drink alcohol, or use illegal drugs. Some items may interact with your medicine. What should I watch for while using this medication? This medication may make you feel generally unwell. This is not uncommon as chemotherapy can affect healthy cells as well as cancer cells. Report any side effects. Continue your course of treatment even though you feel ill unless your care team tells you to stop. You may need blood work done while you are taking this medication. This medication can cause serious side effects and infusion reactions. To reduce the risk, your  care team may give you other medications to take before receiving this one. Be sure to follow the directions from your care team. This medication may increase your risk of getting an infection. Call your care team for advice if you get a fever, chills, sore throat, or other symptoms of a cold or flu. Do not treat yourself. Try to avoid being around people who are sick. Avoid taking medications that contain aspirin, acetaminophen , ibuprofen, naproxen, or ketoprofen unless instructed by your care team. These medications may hide a fever. Be careful brushing or flossing your teeth or using a toothpick because you may get an infection or  bleed more easily. If you have any dental work done, tell your dentist you are receiving this medication. Some products may contain alcohol. Ask your care team if this medication contains alcohol. Be sure to tell all care teams you are taking this medicine. Certain medications, like metronidazole and disulfiram, can cause an unpleasant reaction when taken with alcohol. The reaction includes flushing, headache, nausea, vomiting, sweating, and increased thirst. The reaction can last from 30 minutes to several hours. This medication may affect your coordination, reaction time, or judgement. Do not drive or operate machinery until you know how this medication affects you. Sit up or stand slowly to reduce the risk of dizzy or fainting spells. Drinking alcohol with this medication can increase the risk of these side effects. Talk to your care team about your risk of cancer. You may be more at risk for certain types of cancer if you take this medication. Talk to your care team if you wish to become pregnant or think you might be pregnant. This medication can cause serious birth defects if taken during pregnancy or if you get pregnant within 2 months after stopping therapy. A negative pregnancy test is required before starting this medication. A reliable form of contraception is recommended while taking this medication and for 2 months after stopping it. Talk to your care team about reliable forms of contraception. Do not breast-feed while taking this medication and for 1 week after stopping therapy. Use a condom during sex and for 4 months after stopping therapy. Tell your care team right away if you think your partner might be pregnant. This medication can cause serious birth defects. This medication may cause infertility. Talk to your care team if you are concerned about your fertility. What side effects may I notice from receiving this medication? Side effects that you should report to your care team as soon as  possible: Allergic reactions--skin rash, itching, hives, swelling of the face, lips, tongue, or throat Change in vision such as blurry vision, seeing halos around lights, vision loss Infection--fever, chills, cough, or sore throat Infusion reactions--chest pain, shortness of breath or trouble breathing, feeling faint or lightheaded Low red blood cell level--unusual weakness or fatigue, dizziness, headache, trouble breathing Pain, tingling, or numbness in the hands or feet Painful swelling, warmth, or redness of the skin, blisters or sores at the infusion site Redness, blistering, peeling, or loosening of the skin, including inside the mouth Sudden or severe stomach pain, bloody diarrhea, fever, nausea, vomiting Swelling of the ankles, hands, or feet Tumor lysis syndrome (TLS)--nausea, vomiting, diarrhea, decrease in the amount of urine, dark urine, unusual weakness or fatigue, confusion, muscle pain or cramps, fast or irregular heartbeat, joint pain Unusual bruising or bleeding Side effects that usually do not require medical attention (report to your care team if they continue or are bothersome): Change in nail shape,  thickness, or color Change in taste Hair loss Increased tears This list may not describe all possible side effects. Call your doctor for medical advice about side effects. You may report side effects to FDA at 1-800-FDA-1088. Where should I keep my medication? This medication is given in a hospital or clinic. It will not be stored at home. NOTE: This sheet is a summary. It may not cover all possible information. If you have questions about this medicine, talk to your doctor, pharmacist, or health care provider.  2024 Elsevier/Gold Standard (2022-01-06 00:00:00) Leucovorin Injection What is this medication? LEUCOVORIN (loo koe VOR in) prevents side effects from certain medications, such as methotrexate. It works by increasing folate levels. This helps protect healthy cells in  your body. It may also be used to treat anemia caused by low levels of folate. It can also be used with fluorouracil, a type of chemotherapy, to treat colorectal cancer. It works by increasing the effects of fluorouracil in the body. This medicine may be used for other purposes; ask your health care provider or pharmacist if you have questions. What should I tell my care team before I take this medication? They need to know if you have any of these conditions: Anemia from low levels of vitamin B12 in the blood An unusual or allergic reaction to leucovorin, folic acid, other medications, foods, dyes, or preservatives Pregnant or trying to get pregnant Breastfeeding How should I use this medication? This medication is injected into a vein or a muscle. It is given by your care team in a hospital or clinic setting. Talk to your care team about the use of this medication in children. Special care may be needed. Overdosage: If you think you have taken too much of this medicine contact a poison control center or emergency room at once. NOTE: This medicine is only for you. Do not share this medicine with others. What if I miss a dose? Keep appointments for follow-up doses. It is important not to miss your dose. Call your care team if you are unable to keep an appointment. What may interact with this medication? Capecitabine Fluorouracil Phenobarbital Phenytoin Primidone Trimethoprim;sulfamethoxazole This list may not describe all possible interactions. Give your health care provider a list of all the medicines, herbs, non-prescription drugs, or dietary supplements you use. Also tell them if you smoke, drink alcohol, or use illegal drugs. Some items may interact with your medicine. What should I watch for while using this medication? Your condition will be monitored carefully while you are receiving this medication. This medication may increase the side effects of 5-fluorouracil. Tell your care team  if you have diarrhea or mouth sores that do not get better or that get worse. What side effects may I notice from receiving this medication? Side effects that you should report to your care team as soon as possible: Allergic reactions--skin rash, itching, hives, swelling of the face, lips, tongue, or throat This list may not describe all possible side effects. Call your doctor for medical advice about side effects. You may report side effects to FDA at 1-800-FDA-1088. Where should I keep my medication? This medication is given in a hospital or clinic. It will not be stored at home. NOTE: This sheet is a summary. It may not cover all possible information. If you have questions about this medicine, talk to your doctor, pharmacist, or health care provider.  2024 Elsevier/Gold Standard (2022-04-05 00:00:00) Oxaliplatin Injection What is this medication? OXALIPLATIN (ox AL i PLA tin) treats  colorectal cancer. It works by slowing down the growth of cancer cells. This medicine may be used for other purposes; ask your health care provider or pharmacist if you have questions. COMMON BRAND NAME(S): Eloxatin  What should I tell my care team before I take this medication? They need to know if you have any of these conditions: Heart disease History of irregular heartbeat or rhythm Liver disease Low blood cell levels (white cells, red cells, and platelets) Lung or breathing disease, such as asthma Take medications that treat or prevent blood clots Tingling of the fingers, toes, or other nerve disorder An unusual or allergic reaction to oxaliplatin , other medications, foods, dyes, or preservatives If you or your partner are pregnant or trying to get pregnant Breast-feeding How should I use this medication? This medication is injected into a vein. It is given by your care team in a hospital or clinic setting. Talk to your care team about the use of this medication in children. Special care may be  needed. Overdosage: If you think you have taken too much of this medicine contact a poison control center or emergency room at once. NOTE: This medicine is only for you. Do not share this medicine with others. What if I miss a dose? Keep appointments for follow-up doses. It is important not to miss a dose. Call your care team if you are unable to keep an appointment. What may interact with this medication? Do not take this medication with any of the following: Cisapride Dronedarone Pimozide Thioridazine This medication may also interact with the following: Aspirin and aspirin-like medications Certain medications that treat or prevent blood clots, such as warfarin, apixaban, dabigatran, and rivaroxaban Cisplatin Cyclosporine Diuretics Medications for infection, such as acyclovir, adefovir, amphotericin B, bacitracin, cidofovir, foscarnet, ganciclovir, gentamicin, pentamidine, vancomycin NSAIDs, medications for pain and inflammation, such as ibuprofen or naproxen Other medications that cause heart rhythm changes Pamidronate Zoledronic acid This list may not describe all possible interactions. Give your health care provider a list of all the medicines, herbs, non-prescription drugs, or dietary supplements you use. Also tell them if you smoke, drink alcohol, or use illegal drugs. Some items may interact with your medicine. What should I watch for while using this medication? Your condition will be monitored carefully while you are receiving this medication. You may need blood work while taking this medication. This medication may make you feel generally unwell. This is not uncommon as chemotherapy can affect healthy cells as well as cancer cells. Report any side effects. Continue your course of treatment even though you feel ill unless your care team tells you to stop. This medication may increase your risk of getting an infection. Call your care team for advice if you get a fever, chills,  sore throat, or other symptoms of a cold or flu. Do not treat yourself. Try to avoid being around people who are sick. Avoid taking medications that contain aspirin, acetaminophen , ibuprofen, naproxen, or ketoprofen unless instructed by your care team. These medications may hide a fever. Be careful brushing or flossing your teeth or using a toothpick because you may get an infection or bleed more easily. If you have any dental work done, tell your dentist you are receiving this medication. This medication can make you more sensitive to cold. Do not drink cold drinks or use ice. Cover exposed skin before coming in contact with cold temperatures or cold objects. When out in cold weather wear warm clothing and cover your mouth and nose to warm the  air that goes into your lungs. Tell your care team if you get sensitive to the cold. Talk to your care team if you or your partner are pregnant or think either of you might be pregnant. This medication can cause serious birth defects if taken during pregnancy and for 9 months after the last dose. A negative pregnancy test is required before starting this medication. A reliable form of contraception is recommended while taking this medication and for 9 months after the last dose. Talk to your care team about effective forms of contraception. Do not father a child while taking this medication and for 6 months after the last dose. Use a condom while having sex during this time period. Do not breastfeed while taking this medication and for 3 months after the last dose. This medication may cause infertility. Talk to your care team if you are concerned about your fertility. What side effects may I notice from receiving this medication? Side effects that you should report to your care team as soon as possible: Allergic reactions--skin rash, itching, hives, swelling of the face, lips, tongue, or throat Bleeding--bloody or black, tar-like stools, vomiting blood or brown  material that looks like coffee grounds, red or dark brown urine, small red or purple spots on skin, unusual bruising or bleeding Dry cough, shortness of breath or trouble breathing Heart rhythm changes--fast or irregular heartbeat, dizziness, feeling faint or lightheaded, chest pain, trouble breathing Infection--fever, chills, cough, sore throat, wounds that don't heal, pain or trouble when passing urine, general feeling of discomfort or being unwell Liver injury--right upper belly pain, loss of appetite, nausea, light-colored stool, dark yellow or brown urine, yellowing skin or eyes, unusual weakness or fatigue Low red blood cell level--unusual weakness or fatigue, dizziness, headache, trouble breathing Muscle injury--unusual weakness or fatigue, muscle pain, dark yellow or brown urine, decrease in amount of urine Pain, tingling, or numbness in the hands or feet Sudden and severe headache, confusion, change in vision, seizures, which may be signs of posterior reversible encephalopathy syndrome (PRES) Unusual bruising or bleeding Side effects that usually do not require medical attention (report to your care team if they continue or are bothersome): Diarrhea Nausea Pain, redness, or swelling with sores inside the mouth or throat Unusual weakness or fatigue Vomiting Cold Sensitivity This list may not describe all possible side effects. Call your doctor for medical advice about side effects. You may report side effects to FDA at 1-800-FDA-1088. Where should I keep my medication? This medication is given in a hospital or clinic. It will not be stored at home. NOTE: This sheet is a summary. It may not cover all possible information. If you have questions about this medicine, talk to your doctor, pharmacist, or health care provider.  2024 Elsevier/Gold Standard (2023-10-13 00:00:00) Fluorouracil Injection What is this medication? FLUOROURACIL (flure oh YOOR a sil) treats some types of cancer. It  works by slowing down the growth of cancer cells. This medicine may be used for other purposes; ask your health care provider or pharmacist if you have questions. COMMON BRAND NAME(S): Adrucil What should I tell my care team before I take this medication? They need to know if you have any of these conditions: Blood disorders Dihydropyrimidine dehydrogenase (DPD) deficiency Infection, such as chickenpox, cold sores, herpes Kidney disease Liver disease Poor nutrition Recent or ongoing radiation therapy An unusual or allergic reaction to fluorouracil, other medications, foods, dyes, or preservatives If you or your partner are pregnant or trying to get pregnant  Breast-feeding How should I use this medication? This medication is injected into a vein. It is administered by your care team in a hospital or clinic setting. Talk to your care team about the use of this medication in children. Special care may be needed. Overdosage: If you think you have taken too much of this medicine contact a poison control center or emergency room at once. NOTE: This medicine is only for you. Do not share this medicine with others. What if I miss a dose? Keep appointments for follow-up doses. It is important not to miss your dose. Call your care team if you are unable to keep an appointment. What may interact with this medication? Do not take this medication with any of the following: Live virus vaccines This medication may also interact with the following: Medications that treat or prevent blood clots, such as warfarin, enoxaparin, dalteparin This list may not describe all possible interactions. Give your health care provider a list of all the medicines, herbs, non-prescription drugs, or dietary supplements you use. Also tell them if you smoke, drink alcohol, or use illegal drugs. Some items may interact with your medicine. What should I watch for while using this medication? Your condition will be monitored  carefully while you are receiving this medication. This medication may make you feel generally unwell. This is not uncommon as chemotherapy can affect healthy cells as well as cancer cells. Report any side effects. Continue your course of treatment even though you feel ill unless your care team tells you to stop. In some cases, you may be given additional medications to help with side effects. Follow all directions for their use. This medication may increase your risk of getting an infection. Call your care team for advice if you get a fever, chills, sore throat, or other symptoms of a cold or flu. Do not treat yourself. Try to avoid being around people who are sick. This medication may increase your risk to bruise or bleed. Call your care team if you notice any unusual bleeding. Be careful brushing or flossing your teeth or using a toothpick because you may get an infection or bleed more easily. If you have any dental work done, tell your dentist you are receiving this medication. Avoid taking medications that contain aspirin, acetaminophen , ibuprofen, naproxen, or ketoprofen unless instructed by your care team. These medications may hide a fever. Do not treat diarrhea with over the counter products. Contact your care team if you have diarrhea that lasts more than 2 days or if it is severe and watery. This medication can make you more sensitive to the sun. Keep out of the sun. If you cannot avoid being in the sun, wear protective clothing and sunscreen. Do not use sun lamps, tanning beds, or tanning booths. Talk to your care team if you or your partner wish to become pregnant or think you might be pregnant. This medication can cause serious birth defects if taken during pregnancy and for 3 months after the last dose. A reliable form of contraception is recommended while taking this medication and for 3 months after the last dose. Talk to your care team about effective forms of contraception. Do not father  a child while taking this medication and for 3 months after the last dose. Use a condom while having sex during this time period. Do not breastfeed while taking this medication. This medication may cause infertility. Talk to your care team if you are concerned about your fertility. What side effects may  I notice from receiving this medication? Side effects that you should report to your care team as soon as possible: Allergic reactions--skin rash, itching, hives, swelling of the face, lips, tongue, or throat Heart attack--pain or tightness in the chest, shoulders, arms, or jaw, nausea, shortness of breath, cold or clammy skin, feeling faint or lightheaded Heart failure--shortness of breath, swelling of the ankles, feet, or hands, sudden weight gain, unusual weakness or fatigue Heart rhythm changes--fast or irregular heartbeat, dizziness, feeling faint or lightheaded, chest pain, trouble breathing High ammonia level--unusual weakness or fatigue, confusion, loss of appetite, nausea, vomiting, seizures Infection--fever, chills, cough, sore throat, wounds that don't heal, pain or trouble when passing urine, general feeling of discomfort or being unwell Low red blood cell level--unusual weakness or fatigue, dizziness, headache, trouble breathing Pain, tingling, or numbness in the hands or feet, muscle weakness, change in vision, confusion or trouble speaking, loss of balance or coordination, trouble walking, seizures Redness, swelling, and blistering of the skin over hands and feet Severe or prolonged diarrhea Unusual bruising or bleeding Side effects that usually do not require medical attention (report to your care team if they continue or are bothersome): Dry skin Headache Increased tears Nausea Pain, redness, or swelling with sores inside the mouth or throat Sensitivity to light Vomiting This list may not describe all possible side effects. Call your doctor for medical advice about side  effects. You may report side effects to FDA at 1-800-FDA-1088. Where should I keep my medication? This medication is given in a hospital or clinic. It will not be stored at home. NOTE: This sheet is a summary. It may not cover all possible information. If you have questions about this medicine, talk to your doctor, pharmacist, or health care provider.  2024 Elsevier/Gold Standard (2022-03-08 00:00:00)

## 2024-10-17 NOTE — Progress Notes (Signed)
 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

## 2024-10-18 ENCOUNTER — Other Ambulatory Visit: Payer: Self-pay

## 2024-10-18 ENCOUNTER — Inpatient Hospital Stay

## 2024-10-18 ENCOUNTER — Encounter: Payer: Self-pay | Admitting: Thoracic Surgery (Cardiothoracic Vascular Surgery)

## 2024-10-18 ENCOUNTER — Encounter: Payer: Self-pay | Admitting: Hematology

## 2024-10-18 ENCOUNTER — Ambulatory Visit: Admitting: Thoracic Surgery (Cardiothoracic Vascular Surgery)

## 2024-10-18 VITALS — BP 155/96 | HR 64 | Resp 17

## 2024-10-18 DIAGNOSIS — C155 Malignant neoplasm of lower third of esophagus: Secondary | ICD-10-CM | POA: Diagnosis not present

## 2024-10-18 MED ORDER — PEGFILGRASTIM-JMDB 6 MG/0.6ML ~~LOC~~ SOSY
6.0000 mg | PREFILLED_SYRINGE | Freq: Once | SUBCUTANEOUS | Status: AC
  Administered 2024-10-18: 6 mg via SUBCUTANEOUS
  Filled 2024-10-18: qty 0.6

## 2024-10-18 NOTE — Progress Notes (Signed)
 PATIENT NAVIGATOR PROGRESS NOTE  Name: Toni Dougherty Date: 10/18/2024 MRN: [id number]  DOB: [birth year]  Patient is established with a treatment plan and is actively engaged in care. Nurse Navigator services not currently indicated at this time. Will re-evaluate if needs change or if additional support is requested.

## 2024-10-19 ENCOUNTER — Inpatient Hospital Stay

## 2024-10-21 ENCOUNTER — Telehealth: Payer: Self-pay

## 2024-10-21 NOTE — Telephone Encounter (Signed)
-----   Message from Nurse Almarie A sent at 10/17/2024  4:17 PM EST ----- Regarding: First timer First time Imfinzi , FLOT. Tolerated well.

## 2024-10-24 ENCOUNTER — Other Ambulatory Visit: Payer: Self-pay

## 2024-10-24 ENCOUNTER — Emergency Department (HOSPITAL_BASED_OUTPATIENT_CLINIC_OR_DEPARTMENT_OTHER)

## 2024-10-24 ENCOUNTER — Encounter (HOSPITAL_BASED_OUTPATIENT_CLINIC_OR_DEPARTMENT_OTHER): Payer: Self-pay | Admitting: Emergency Medicine

## 2024-10-24 ENCOUNTER — Inpatient Hospital Stay (HOSPITAL_BASED_OUTPATIENT_CLINIC_OR_DEPARTMENT_OTHER)
Admission: EM | Admit: 2024-10-24 | Discharge: 2024-10-27 | DRG: 378 | Disposition: A | Attending: Internal Medicine | Admitting: Internal Medicine

## 2024-10-24 DIAGNOSIS — R748 Abnormal levels of other serum enzymes: Secondary | ICD-10-CM | POA: Diagnosis present

## 2024-10-24 DIAGNOSIS — E876 Hypokalemia: Secondary | ICD-10-CM | POA: Diagnosis present

## 2024-10-24 DIAGNOSIS — Z9221 Personal history of antineoplastic chemotherapy: Secondary | ICD-10-CM | POA: Diagnosis not present

## 2024-10-24 DIAGNOSIS — C159 Malignant neoplasm of esophagus, unspecified: Secondary | ICD-10-CM | POA: Diagnosis present

## 2024-10-24 DIAGNOSIS — R197 Diarrhea, unspecified: Secondary | ICD-10-CM

## 2024-10-24 DIAGNOSIS — I10 Essential (primary) hypertension: Secondary | ICD-10-CM | POA: Diagnosis present

## 2024-10-24 DIAGNOSIS — K5792 Diverticulitis of intestine, part unspecified, without perforation or abscess without bleeding: Secondary | ICD-10-CM | POA: Diagnosis present

## 2024-10-24 DIAGNOSIS — Z811 Family history of alcohol abuse and dependence: Secondary | ICD-10-CM | POA: Diagnosis not present

## 2024-10-24 DIAGNOSIS — R109 Unspecified abdominal pain: Secondary | ICD-10-CM | POA: Diagnosis not present

## 2024-10-24 DIAGNOSIS — Z823 Family history of stroke: Secondary | ICD-10-CM | POA: Diagnosis not present

## 2024-10-24 DIAGNOSIS — N179 Acute kidney failure, unspecified: Secondary | ICD-10-CM | POA: Diagnosis present

## 2024-10-24 DIAGNOSIS — E869 Volume depletion, unspecified: Secondary | ICD-10-CM | POA: Diagnosis present

## 2024-10-24 DIAGNOSIS — E860 Dehydration: Secondary | ICD-10-CM | POA: Diagnosis present

## 2024-10-24 DIAGNOSIS — F988 Other specified behavioral and emotional disorders with onset usually occurring in childhood and adolescence: Secondary | ICD-10-CM | POA: Diagnosis present

## 2024-10-24 DIAGNOSIS — Z79899 Other long term (current) drug therapy: Secondary | ICD-10-CM | POA: Diagnosis not present

## 2024-10-24 DIAGNOSIS — K5732 Diverticulitis of large intestine without perforation or abscess without bleeding: Secondary | ICD-10-CM | POA: Diagnosis not present

## 2024-10-24 DIAGNOSIS — T451X5A Adverse effect of antineoplastic and immunosuppressive drugs, initial encounter: Secondary | ICD-10-CM

## 2024-10-24 DIAGNOSIS — K5733 Diverticulitis of large intestine without perforation or abscess with bleeding: Secondary | ICD-10-CM | POA: Diagnosis present

## 2024-10-24 DIAGNOSIS — F17210 Nicotine dependence, cigarettes, uncomplicated: Secondary | ICD-10-CM | POA: Diagnosis present

## 2024-10-24 DIAGNOSIS — R9431 Abnormal electrocardiogram [ECG] [EKG]: Secondary | ICD-10-CM | POA: Diagnosis present

## 2024-10-24 DIAGNOSIS — Z6372 Alcoholism and drug addiction in family: Secondary | ICD-10-CM | POA: Diagnosis not present

## 2024-10-24 DIAGNOSIS — Z8249 Family history of ischemic heart disease and other diseases of the circulatory system: Secondary | ICD-10-CM | POA: Diagnosis not present

## 2024-10-24 DIAGNOSIS — D259 Leiomyoma of uterus, unspecified: Secondary | ICD-10-CM | POA: Diagnosis not present

## 2024-10-24 DIAGNOSIS — E785 Hyperlipidemia, unspecified: Secondary | ICD-10-CM | POA: Diagnosis present

## 2024-10-24 DIAGNOSIS — R112 Nausea with vomiting, unspecified: Secondary | ICD-10-CM | POA: Diagnosis present

## 2024-10-24 HISTORY — DX: Malignant (primary) neoplasm, unspecified: C80.1

## 2024-10-24 LAB — COMPREHENSIVE METABOLIC PANEL WITH GFR
ALT: 12 U/L (ref 0–44)
AST: 18 U/L (ref 15–41)
Albumin: 4.1 g/dL (ref 3.5–5.0)
Alkaline Phosphatase: 112 U/L (ref 38–126)
Anion gap: 15 (ref 5–15)
BUN: 29 mg/dL — ABNORMAL HIGH (ref 6–20)
CO2: 25 mmol/L (ref 22–32)
Calcium: 9.7 mg/dL (ref 8.9–10.3)
Chloride: 100 mmol/L (ref 98–111)
Creatinine, Ser: 1.82 mg/dL — ABNORMAL HIGH (ref 0.44–1.00)
GFR, Estimated: 32 mL/min — ABNORMAL LOW (ref >60)
Glucose, Bld: 102 mg/dL — ABNORMAL HIGH (ref 70–99)
Potassium: 3.4 mmol/L — ABNORMAL LOW (ref 3.5–5.1)
Sodium: 140 mmol/L (ref 135–145)
Total Bilirubin: 0.4 mg/dL (ref 0.0–1.2)
Total Protein: 6.9 g/dL (ref 6.5–8.1)

## 2024-10-24 LAB — URINALYSIS, ROUTINE W REFLEX MICROSCOPIC
Bacteria, UA: NONE SEEN
Bilirubin Urine: NEGATIVE
Glucose, UA: NEGATIVE mg/dL
Leukocytes,Ua: NEGATIVE
Nitrite: NEGATIVE
Protein, ur: 30 mg/dL — AB
Specific Gravity, Urine: 1.028 (ref 1.005–1.030)
pH: 6 (ref 5.0–8.0)

## 2024-10-24 LAB — CBC WITH DIFFERENTIAL/PLATELET
Abs Immature Granulocytes: 0.08 K/uL — ABNORMAL HIGH (ref 0.00–0.07)
Basophils Absolute: 0.1 K/uL (ref 0.0–0.1)
Basophils Relative: 1 %
Eosinophils Absolute: 0.1 K/uL (ref 0.0–0.5)
Eosinophils Relative: 1 %
HCT: 41.1 % (ref 36.0–46.0)
Hemoglobin: 14.2 g/dL (ref 12.0–15.0)
Immature Granulocytes: 1 %
Lymphocytes Relative: 23 %
Lymphs Abs: 2.4 K/uL (ref 0.7–4.0)
MCH: 31.1 pg (ref 26.0–34.0)
MCHC: 34.5 g/dL (ref 30.0–36.0)
MCV: 90.1 fL (ref 80.0–100.0)
Monocytes Absolute: 0.6 K/uL (ref 0.1–1.0)
Monocytes Relative: 6 %
Neutro Abs: 6.9 K/uL (ref 1.7–7.7)
Neutrophils Relative %: 68 %
Platelets: 218 K/uL (ref 150–400)
RBC: 4.56 MIL/uL (ref 3.87–5.11)
RDW: 13.4 % (ref 11.5–15.5)
WBC: 10.2 K/uL (ref 4.0–10.5)
nRBC: 0 % (ref 0.0–0.2)

## 2024-10-24 LAB — C DIFFICILE QUICK SCREEN W PCR REFLEX
C Diff antigen: NEGATIVE
C Diff interpretation: NOT DETECTED
C Diff toxin: NEGATIVE

## 2024-10-24 LAB — TROPONIN T, HIGH SENSITIVITY
Troponin T High Sensitivity: <15 ng/L (ref 0–19)
Troponin T High Sensitivity: 16 ng/L (ref 0–19)

## 2024-10-24 LAB — LIPASE, BLOOD: Lipase: 16 U/L (ref 11–51)

## 2024-10-24 MED ORDER — SODIUM CHLORIDE 0.9 % IV BOLUS
1000.0000 mL | Freq: Once | INTRAVENOUS | Status: AC
  Administered 2024-10-24: 1000 mL via INTRAVENOUS

## 2024-10-24 MED ORDER — SODIUM CHLORIDE 0.9% FLUSH: 3.0000 mL | INTRAVENOUS | Status: DC | PRN

## 2024-10-24 MED ORDER — POTASSIUM CHLORIDE 10 MEQ/100ML IV SOLN: 10.0000 meq | INTRAVENOUS | Status: DC

## 2024-10-24 MED ORDER — SODIUM CHLORIDE 0.9 % IV SOLN: INTRAVENOUS | Status: DC

## 2024-10-24 MED ORDER — POLYETHYLENE GLYCOL 3350 17 G PO PACK: 17.0000 g | PACK | Freq: Every day | ORAL | Status: DC | PRN

## 2024-10-24 MED ORDER — ONDANSETRON HCL 4 MG PO TABS: 4.0000 mg | ORAL_TABLET | Freq: Four times a day (QID) | ORAL | Status: DC | PRN

## 2024-10-24 MED ORDER — ACETAMINOPHEN 325 MG PO TABS: 650.0000 mg | ORAL_TABLET | Freq: Four times a day (QID) | ORAL | Status: DC | PRN

## 2024-10-24 MED ORDER — MORPHINE SULFATE (PF) 4 MG/ML IV SOLN
4.0000 mg | Freq: Once | INTRAVENOUS | Status: DC
  Filled 2024-10-24 (×2): qty 1

## 2024-10-24 MED ORDER — ACETAMINOPHEN 650 MG RE SUPP: 650.0000 mg | Freq: Four times a day (QID) | RECTAL | Status: DC | PRN

## 2024-10-24 MED ORDER — SODIUM CHLORIDE 0.9% FLUSH
3.0000 mL | Freq: Two times a day (BID) | INTRAVENOUS | Status: DC
  Administered 2024-10-24 – 2024-10-27 (×4): 3 mL via INTRAVENOUS

## 2024-10-24 MED ORDER — SODIUM CHLORIDE 0.9 % IV SOLN
2.0000 g | Freq: Once | INTRAVENOUS | Status: AC
  Administered 2024-10-24: 2 g via INTRAVENOUS
  Filled 2024-10-24: qty 12.5

## 2024-10-24 MED ORDER — METRONIDAZOLE 500 MG/100ML IV SOLN
500.0000 mg | Freq: Once | INTRAVENOUS | Status: AC
  Administered 2024-10-24: 500 mg via INTRAVENOUS
  Filled 2024-10-24: qty 100

## 2024-10-24 MED ORDER — HEPARIN SODIUM (PORCINE) 5000 UNIT/ML IJ SOLN
5000.0000 [IU] | Freq: Three times a day (TID) | INTRAMUSCULAR | Status: DC
  Administered 2024-10-24 – 2024-10-25 (×2): 5000 [IU] via SUBCUTANEOUS
  Filled 2024-10-24 (×2): qty 1

## 2024-10-24 MED ORDER — HYDRALAZINE HCL 20 MG/ML IJ SOLN
10.0000 mg | Freq: Four times a day (QID) | INTRAMUSCULAR | Status: DC | PRN
  Administered 2024-10-26: 10 mg via INTRAVENOUS
  Filled 2024-10-24: qty 1

## 2024-10-24 MED ORDER — CEFTRIAXONE SODIUM 2 G IJ SOLR
2.0000 g | INTRAMUSCULAR | Status: DC
  Administered 2024-10-24 – 2024-10-26 (×3): 2 g via INTRAVENOUS
  Filled 2024-10-24 (×3): qty 20

## 2024-10-24 MED ORDER — ONDANSETRON HCL 4 MG/2ML IJ SOLN
4.0000 mg | Freq: Once | INTRAMUSCULAR | Status: AC
  Administered 2024-10-24: 4 mg via INTRAVENOUS
  Filled 2024-10-24 (×2): qty 2

## 2024-10-24 MED ORDER — SODIUM CHLORIDE 0.9 % IV SOLN: 250.0000 mL | INTRAVENOUS | Status: AC | PRN

## 2024-10-24 MED ORDER — ONDANSETRON HCL 4 MG/2ML IJ SOLN
4.0000 mg | Freq: Four times a day (QID) | INTRAMUSCULAR | Status: DC | PRN
  Administered 2024-10-25 – 2024-10-26 (×3): 4 mg via INTRAVENOUS
  Filled 2024-10-24 (×3): qty 2

## 2024-10-24 MED FILL — Metronidazole IV Soln 500 MG/100ML: 500.0000 mg | INTRAVENOUS | Qty: 100 | Status: AC

## 2024-10-24 NOTE — ED Notes (Signed)
 Pt request Port accessed... Unable to current access... Port [employer] notified.[email or web address]

## 2024-10-24 NOTE — ED Notes (Signed)
 Purple man Green.

## 2024-10-24 NOTE — H&P (Signed)
 [facility] History and Physical  Toni Dougherty FMW:[id number] DOB: 12/06/1970 DOA: 10/24/2024  Referring physician: ED  PCP: Sheldon Netter, PA   Patient is coming from: Home  Chief Complaint: Diarrhea  HPI:  Patient is a 53 years old female with past medical history of hypertension, ADD, esophageal cancer presented to hospital from [facility] with complaints of abdominal pain with diarrhea.  She also had nausea and vomiting.  Patient tried some Imodium but did not help and was having diarrhea the whole night without any blood in the stool.  Denies any fevers, she also complained of dizziness and weakness.  Denied use of antibiotics recently.  Patient has a EGD done on September 18, 2024 with showed a fungating mass and biopsy was taken at that time.  Of note patient has had a session of chemotherapy for esophageal cancer.  At [facility], CT scan of the abdomen was done which showed diverticulitis and labs was surgeries of AKI show was considered for admission to the hospital.  Patient denied any urinary urgency, frequency or dysuria.  Denies any sick contacts or recent travel.  Denies any chest pain, shortness of breath or dyspnea.  Initial vitals were stable.  Patient afebrile labs were notable for normal WBC.  Lipase was 16.  BMP showed potassium of 3.4 with creatinine elevation at 1.8.  Urinalysis was negative for white cells.  C. difficile panel and GI pathogen panel with sent from the ED pending.  See the scan of the abdomen showed acute uncomplicated sigmoid diverticulitis.  EKG showed normal sinus rhythm.  Patient received morphine, cefepime metronidazole 1 L of normal saline bolus and was considered for admission to the hospital for further evaluation and treatment.   Assessment and Plan Principal Problem:   Diverticulitis Active Problems:   Primary hypertension   Hypokalemia   Esophageal cancer (HCC)   AKI (acute kidney injury)   Volume depletion   Abdominal pain with  diarrhea, concern for sigmoid diverticulitis.  history of chemotherapy for esophageal cancer.  CT scan showing diverticulitis.  Will consider antibiotics for now.  Check C. difficile assay and GI pathogen panel.  Patient does not have fever or leukocytosis will continue to monitor closely.  Continue supportive care with IV hydration antiemetics analgesics as necessary.  Mild acute kidney injury secondary to volume depletion.  Continue with IV hydration.  Check levels in AM.  Creatinine on presentation at 1.8.  Previous creatinine a week back was 1.0.  Will continue to monitor after hydration  Mild hypokalemia.  Will replenish through IV .  Check levels in AM.  History of esophageal cancer.  Undergoing chemotherapy.  Seen by Dr. Lanny as outpatient.  Takes sucralfate  at home.  Currently denies any nausea and states that she can tolerate liquids  History of ADD On Adderall at home.  Hyperlipidemia.  On Crestor .  Will hold for now  Hypertension.  Supposed to take amlodipine , Nebivolol  at home.  Will hold for now.  As needed hydralazine for now  DVT Prophylaxis: Heparin  subcu  Review of Systems:  All systems were reviewed and were negative unless otherwise mentioned in the HPI   Past Medical History:  Diagnosis Date   ADD (attention deficit disorder)    Cancer (HCC)    HTN (hypertension)    Narcolepsy    Past Surgical History:  Procedure Laterality Date   APPENDECTOMY     ESOPHAGOGASTRODUODENOSCOPY N/A 10/02/2024   Procedure: EGD (ESOPHAGOGASTRODUODENOSCOPY);  Surgeon: Burnette Fallow, MD;  Location: [facility];  Service:  Gastroenterology;  Laterality: N/A;   EUS N/A 10/02/2024   Procedure: ULTRASOUND, UPPER GI TRACT, ENDOSCOPIC;  Surgeon: Burnette Fallow, MD;  Location: [facility];  Service: Gastroenterology;  Laterality: N/A;   IR IMAGING GUIDED PORT INSERTION  10/09/2024   rotate cuff Right 2012    Social History:  reports that she has been smoking cigarettes. She has a 12.5  pack-year smoking history. She has been exposed to tobacco smoke. She has never used smokeless tobacco. She reports current alcohol use of about 10.0 standard drinks of alcohol per week. She reports that she does not currently use drugs.  Allergies[1]  Family History  Problem Relation Age of Onset   Hypertension Father    Stroke Father    Alcoholism Father    Alcoholism Sister    Alcoholism Sister    Alcoholism Sister    Alcoholism Sister    Alcoholism Brother    Alcoholism Brother    Alcoholism Brother      Prior to Admission medications  Medication Sig Start Date End Date Taking? Authorizing Provider  amLODipine  (NORVASC ) 5 MG tablet Take 1 tablet (5 mg total) by mouth daily. 02/02/24     amphetamine -dextroamphetamine  (ADDERALL) 30 MG tablet Take 1 tablet by mouth 2 (two) times daily. 02/02/24     amphetamine -dextroamphetamine  (ADDERALL) 30 MG tablet Take 1 tablet by mouth 2 (two) times daily. 09/05/24     amphetamine -dextroamphetamine  (ADDERALL) 30 MG tablet Take 1 tablet by mouth 2 (two) times daily. 09/05/24     amphetamine -dextroamphetamine  (ADDERALL) 30 MG tablet 1 tablet Orally Twice a day (Fill date 10/15/2024) 10/15/24     dexamethasone  (DECADRON ) 4 MG tablet Take 2 tablets (8 mg total) by mouth daily. Start the day after chemotherapy for 2 days. Take with food. 10/03/24   Lanny Callander, MD  lidocaine -prilocaine  (EMLA ) cream Apply to affected area once 10/03/24   Lanny Callander, MD  nebivolol  (BYSTOLIC ) 5 MG tablet Take 1 tablet (5 mg total) by mouth daily. 11/16/23   Joshua Debby CROME, MD  nebivolol  (BYSTOLIC ) 5 MG tablet Take 2 tablets (10 mg total) by mouth daily. 10/14/24     nicotine  (NICODERM CQ  - DOSED IN MG/24 HR) 7 mg/24hr patch Place 1 patch (7 mg total) onto the skin daily. 10/21/24   Lanny Callander, MD  ondansetron  (ZOFRAN ) 8 MG tablet Take 1 tablet (8 mg total) by mouth every 8 (eight) hours as needed for nausea or vomiting. Start on the third day after chemotherapy. 10/03/24   Lanny Callander, MD  prochlorperazine  (COMPAZINE ) 10 MG tablet Take 1 tablet (10 mg total) by mouth every 6 (six) hours as needed for nausea or vomiting. 10/03/24   Lanny Callander, MD  rosuvastatin  (CRESTOR ) 20 MG tablet Take 1 tablet (20 mg total) by mouth daily. 07/03/23   Joshua Debby CROME, MD  sucralfate  (CARAFATE ) 1 g tablet Take 1 tablet (1 g total) by mouth 2 (two) times daily on an empty stomach. 09/10/24     triamcinolone  cream (KENALOG ) 0.1 % Apply to affected area(s) twice daily as needed 03/08/24       Physical Exam:  Vitals:   10/24/24 1600 10/24/24 1630 10/24/24 1714 10/24/24 1735  BP: (!) 146/94 124/79  (!) 141/86  Pulse: 67 64  (!) 59  Resp: 15 17  16   Temp:    98.4 F (36.9 C)  TempSrc:   (P) Oral Oral  SpO2: 100% 100%  100%  Weight:   (P) 86.2 kg   Height:   (  P) 6' (1.829 m)    Wt Readings from Last 3 Encounters:  10/24/24 (P) 86.2 kg  10/16/24 87 kg  10/09/24 81.6 kg   Body mass index is 25.77 kg/m (pended).  General:  Average built, not in obvious distress HENT: Normocephalic, No scleral pallor or icterus noted. Oral mucosa is slightly dry Chest:  Clear breath sounds.  . No crackles or wheezes.  Chest wall port in place CVS: S1 &S2 heard. No murmur.  Regular rate and rhythm. Abdomen: Soft, mild tenderness over the left lower quadrant nondistended.  Bowel sounds are heard. No abdominal mass palpated Extremities: No cyanosis, clubbing or edema.  Peripheral pulses are palpable. Psych: Alert, awake and oriented, normal mood CNS:  No cranial nerve deficits.  Power equal in all extremities.   Skin: Warm and dry.  No rashes noted.  Labs on Admission:   CBC: Recent Labs  Lab 10/24/24 0935  WBC 10.2  NEUTROABS 6.9  HGB 14.2  HCT 41.1  MCV 90.1  PLT 218    Basic Metabolic Panel: Recent Labs  Lab 10/24/24 0935  NA 140  K 3.4*  CL 100  CO2 25  GLUCOSE 102*  BUN 29*  CREATININE 1.82*  CALCIUM  9.7    Liver Function Tests: Recent Labs  Lab 10/24/24 0935  AST 18   ALT 12  ALKPHOS 112  BILITOT 0.4  PROT 6.9  ALBUMIN 4.1   Recent Labs  Lab 10/24/24 0935  LIPASE 16   No results for input(s): AMMONIA in the last 168 hours.  Cardiac Enzymes: No results for input(s): CKTOTAL, CKMB, CKMBINDEX, TROPONINI in the last 168 hours.  BNP (last 3 results) No results for input(s): BNP in the last 8760 hours.  ProBNP (last 3 results) No results for input(s): PROBNP in the last 8760 hours.  CBG: No results for input(s): GLUCAP in the last 168 hours.  Lipase     Component Value Date/Time   LIPASE 16 10/24/2024 0935     Urinalysis    Component Value Date/Time   COLORURINE YELLOW 10/24/2024 1300   APPEARANCEUR CLEAR 10/24/2024 1300   LABSPEC 1.028 10/24/2024 1300   PHURINE 6.0 10/24/2024 1300   GLUCOSEU NEGATIVE 10/24/2024 1300   GLUCOSEU NEGATIVE 09/23/2022 1001   HGBUR MODERATE (A) 10/24/2024 1300   BILIRUBINUR NEGATIVE 10/24/2024 1300   KETONESUR TRACE (A) 10/24/2024 1300   PROTEINUR 30 (A) 10/24/2024 1300   UROBILINOGEN 0.2 09/23/2022 1001   NITRITE NEGATIVE 10/24/2024 1300   LEUKOCYTESUR NEGATIVE 10/24/2024 1300     Drugs of Abuse  No results found for: LABOPIA, COCAINSCRNUR, LABBENZ, AMPHETMU, THCU, LABBARB    Radiological Exams on Admission: CT ABDOMEN PELVIS WO CONTRAST Result Date: 10/24/2024 EXAM: CT Abdomen and Pelvis without Intravenous Contrast CLINICAL HISTORY: Abdominal pain, acute, nonlocalized; diarrhea; recent chemo. Pt endorses n/v/d starting yesterday. Also reports dizziness. Chemo tx last week. Pump removed Friday. * Tracking Code: BO * TECHNIQUE: Axial computed tomography images of the abdomen and pelvis without intravenous contrast. Dose reduction technique was used including one or more of the following: automated exposure control, adjustment of mA and kV according to patient size, and/or iterative reconstruction. CONTRAST: NONE COMPARISON: PET CT 10/11/2024, CT chest and pelvis  09/28/2024. FINDINGS: LUNG BASES: No basilar airspace consolidation or pleural effusion. LIVER: Unremarkable. GALLBLADDER AND BILE DUCTS: Unremarkable. No calcified stone. No ductal dilation. PANCREAS: Unremarkable. SPLEEN: Unremarkable. ADRENAL GLANDS: Unremarkable. KIDNEYS, URETERS, AND BLADDER: Unremarkable. No hydronephrosis or nephrolithiasis. No ureteral or bladder calculi. STOMACH AND BOWEL: The  stomach, duodenum, and small bowel are normal. No evidence of small bowel obstruction or inflammation. Fluid stool throughout the colon. There is mild inflammation along the mesenteric border of the proximal sigmoid colon. There is an enlarged diverticulum in this region on imaging 48 of series 4 and 64 of series 2. No perforation or abscess. The above left colon inflammation is new from most recent CT comparison. APPENDIX: Post appendectomy. PERITONEUM: No free fluid. No free air. LYMPH NODES: No lymphadenopathy. REPRODUCTIVE: The uterus is lobulated with a calcified leiomyoma on the left border. IUD in expected location. VASCULATURE: No aortic aneurysm. ABDOMINAL WALL AND SOFT TISSUES: Unremarkable. BONES: No fracture or suspicious osseous abnormality. IMPRESSION: 1. Acute uncomplicated sigmoid diverticulitis, new compared to prior CT, without perforation or abscess. 2. Lobulated uterus with calcified leiomyoma and IUD in expected position. Electronically signed by: Norleen Boxer MD 10/24/2024 02:17 PM EST RP Workstation: [id number]    EKG: Personally reviewed by me which shows normal sinus rhythm   Consultant: None  Code Status: Full code  Microbiology GI pathogen panel, C. difficile  Antibiotics: Rocephin and Flagyl  Family Communication:  Patients' condition and plan of care including tests being ordered have been discussed with the patient  who indicate understanding and agree with the plan.   Status is: Inpatient  Severity of Illness: The appropriate patient status for this patient is  INPATIENT. Inpatient status is judged to be reasonable and necessary in order to provide the required intensity of service to ensure the patient's safety. The patient's presenting symptoms, physical exam findings, and initial radiographic and laboratory data in the context of their chronic comorbidities is felt to place them at high risk for further clinical deterioration. Furthermore, it is not anticipated that the patient will be medically stable for discharge from the hospital within 2 midnights of admission.   * I certify that at the point of admission it is my clinical judgment that the patient will require inpatient hospital care spanning beyond 2 midnights from the point of admission due to high intensity of service, high risk for further deterioration and high frequency of surveillance required.*  Signed, Vernal Alstrom, MD [facility] 10/24/2024        [1]  Allergies Allergen Reactions   Erythromycin Nausea And Vomiting   Penicillins Hives

## 2024-10-24 NOTE — ED Triage Notes (Signed)
 Pt endorses n/v/d starting yesterday.Also reports dizziness. Chemo tx last week. Pump removed Friday

## 2024-10-24 NOTE — Hospital Course (Addendum)
 Patient with PMH of HTN, esophageal cancer recently on chemotherapy, ADD presented to the hospital with complaints of abdominal pain and diarrhea. She also reports blood in the stool. Found to have diverticulitis. No prior episodes of bradycardias reported by the patient.  Assessment and plan. Acute diverticulitis of sigmoid colon. Possible diverticular bleed. Recently diagnosed with esophageal cancer and started on chemotherapy. Presents with abdominal pain. CT scan showing diverticulitis. Currently on IV antibiotic. C. difficile negative.  GI pathogen panel negative. Was on clear liquid diet.  At home generally takes liquid diet due to her esophageal cancer.  Will advance to soft diet and monitor. Continue pain control.  Possible diverticular bleed. Patient reports that she has seen bright red blood per rectum.  Also reports some clot in the stool. Symptoms have been ongoing along with her diverticulitis. For now monitor H&H. Transfer for hemoglobin less than 7 or hemodynamic instability. Currently no indication for GI consultation given stability of H&H.  Acute kidney injury  Baseline serum creatinine normal. Creatinine on admission 1.8. Treated with IV fluid. Secondary to volume depletion.  Mild hypokalemia. Replaced.  History of esophageal cancer.   Undergoing chemotherapy.  History of ADD On Adderall at home.  Hyperlipidemia.   On Crestor .  Will hold for now  Hypertension.  Continue home blood pressure medication.

## 2024-10-24 NOTE — ED Provider Notes (Signed)
 [place] EMERGENCY DEPARTMENT AT [facility] Provider Note   CSN: [id number] Arrival date & time: 10/24/24  9081     Patient presents with: Immunotherrapy Card and Diarrhea   Toni Dougherty is a 53 y.o. female.   Pt is a 53 yo female with pmhx significant for esophageal cancer (getting FLOT chemo and durvalumab  with first dose on 12/4-12/5), HTN, ADD, and Narcolepsy.  She developed n/v/d last night.  She did try imodium, but it did not help.  She was up all night with the diarrhea.  She did not see blood in it.  She denies fevers.  She feels dizzy.  No recent abx.       Prior to Admission medications  Medication Sig Start Date End Date Taking? Authorizing Provider  amLODipine  (NORVASC ) 5 MG tablet Take 1 tablet (5 mg total) by mouth daily. 02/02/24     amphetamine -dextroamphetamine  (ADDERALL) 30 MG tablet Take 1 tablet by mouth 2 (two) times daily. 02/02/24     amphetamine -dextroamphetamine  (ADDERALL) 30 MG tablet Take 1 tablet by mouth 2 (two) times daily. 09/05/24     amphetamine -dextroamphetamine  (ADDERALL) 30 MG tablet Take 1 tablet by mouth 2 (two) times daily. 09/05/24     amphetamine -dextroamphetamine  (ADDERALL) 30 MG tablet 1 tablet Orally Twice a day (Fill date 10/15/2024) 10/15/24     dexamethasone  (DECADRON ) 4 MG tablet Take 2 tablets (8 mg total) by mouth daily. Start the day after chemotherapy for 2 days. Take with food. 10/03/24   Lanny Callander, MD  lidocaine -prilocaine  (EMLA ) cream Apply to affected area once 10/03/24   Lanny Callander, MD  nebivolol  (BYSTOLIC ) 5 MG tablet Take 1 tablet (5 mg total) by mouth daily. 11/16/23   Joshua Debby CROME, MD  nebivolol  (BYSTOLIC ) 5 MG tablet Take 2 tablets (10 mg total) by mouth daily. 10/14/24     nicotine  (NICODERM CQ  - DOSED IN MG/24 HR) 7 mg/24hr patch Place 1 patch (7 mg total) onto the skin daily. 10/21/24   Lanny Callander, MD  ondansetron  (ZOFRAN ) 8 MG tablet Take 1 tablet (8 mg total) by mouth every 8 (eight) hours as needed for nausea or  vomiting. Start on the third day after chemotherapy. 10/03/24   Lanny Callander, MD  prochlorperazine  (COMPAZINE ) 10 MG tablet Take 1 tablet (10 mg total) by mouth every 6 (six) hours as needed for nausea or vomiting. 10/03/24   Lanny Callander, MD  rosuvastatin  (CRESTOR ) 20 MG tablet Take 1 tablet (20 mg total) by mouth daily. 07/03/23   Joshua Debby CROME, MD  sucralfate  (CARAFATE ) 1 g tablet Take 1 tablet (1 g total) by mouth 2 (two) times daily on an empty stomach. 09/10/24     triamcinolone  cream (KENALOG ) 0.1 % Apply to affected area(s) twice daily as needed 03/08/24       Allergies: Erythromycin and Penicillins    Review of Systems  Gastrointestinal:  Positive for diarrhea, nausea and vomiting.  Neurological:  Positive for dizziness.  All other systems reviewed and are negative.   Updated Vital Signs BP 119/79   Pulse 60   Temp 98.3 F (36.8 C) (Oral)   Resp (!) 21   Wt 86.2 kg   LMP 09/28/2024 (Approximate)   SpO2 99%   BMI 25.77 kg/m   Physical Exam Vitals and nursing note reviewed.  Constitutional:      Appearance: She is ill-appearing.  HENT:     Head: Normocephalic and atraumatic.     Right Ear: External ear normal.  Left Ear: External ear normal.     Nose: Nose normal.     Mouth/Throat:     Mouth: Mucous membranes are dry.  Eyes:     Extraocular Movements: Extraocular movements intact.     Conjunctiva/sclera: Conjunctivae normal.     Pupils: Pupils are equal, round, and reactive to light.  Cardiovascular:     Rate and Rhythm: Normal rate and regular rhythm.     Pulses: Normal pulses.     Heart sounds: Normal heart sounds.  Pulmonary:     Effort: Pulmonary effort is normal.     Breath sounds: Normal breath sounds.  Abdominal:     General: Abdomen is flat. Bowel sounds are normal.     Palpations: Abdomen is soft.  Musculoskeletal:        General: Normal range of motion.     Cervical back: Normal range of motion and neck supple.  Skin:    General: Skin is warm.      Capillary Refill: Capillary refill takes less than 2 seconds.  Neurological:     General: No focal deficit present.     Mental Status: She is alert and oriented to person, place, and time.  Psychiatric:        Mood and Affect: Mood normal.        Behavior: Behavior normal.        Thought Content: Thought content normal.        Judgment: Judgment normal.     (all labs ordered are listed, but only abnormal results are displayed) Labs Reviewed  CBC WITH DIFFERENTIAL/PLATELET - Abnormal; Notable for the following components:      Result Value   Abs Immature Granulocytes 0.08 (*)    All other components within normal limits  COMPREHENSIVE METABOLIC PANEL WITH GFR - Abnormal; Notable for the following components:   Potassium 3.4 (*)    Glucose, Bld 102 (*)    BUN 29 (*)    Creatinine, Ser 1.82 (*)    GFR, Estimated 32 (*)    All other components within normal limits  URINALYSIS, ROUTINE W REFLEX MICROSCOPIC - Abnormal; Notable for the following components:   Hgb urine dipstick MODERATE (*)    Ketones, ur TRACE (*)    Protein, ur 30 (*)    All other components within normal limits  GASTROINTESTINAL PANEL BY PCR, STOOL (REPLACES STOOL CULTURE)  C DIFFICILE QUICK SCREEN W PCR REFLEX    LIPASE, BLOOD  TROPONIN T, HIGH SENSITIVITY  TROPONIN T, HIGH SENSITIVITY    EKG: EKG Interpretation Date/Time:  Thursday October 24 2024 09:53:51 EST Ventricular Rate:  67 PR Interval:  119 QRS Duration:  96 QT Interval:  426 QTC Calculation: 450 R Axis:   48  Text Interpretation: Sinus rhythm Borderline short PR interval Repol abnrm, severe global ischemia (LM/MVD) new ST depressions and t wave changes Confirmed by Dean Clarity [phone]) on 10/24/2024 10:06:45 AM  Radiology: CT ABDOMEN PELVIS WO CONTRAST Result Date: 10/24/2024 EXAM: CT Abdomen and Pelvis without Intravenous Contrast CLINICAL HISTORY: Abdominal pain, acute, nonlocalized; diarrhea; recent chemo. Pt endorses n/v/d starting  yesterday. Also reports dizziness. Chemo tx last week. Pump removed Friday. * Tracking Code: BO * TECHNIQUE: Axial computed tomography images of the abdomen and pelvis without intravenous contrast. Dose reduction technique was used including one or more of the following: automated exposure control, adjustment of mA and kV according to patient size, and/or iterative reconstruction. CONTRAST: NONE COMPARISON: PET CT 10/11/2024, CT chest and pelvis 09/28/2024.  FINDINGS: LUNG BASES: No basilar airspace consolidation or pleural effusion. LIVER: Unremarkable. GALLBLADDER AND BILE DUCTS: Unremarkable. No calcified stone. No ductal dilation. PANCREAS: Unremarkable. SPLEEN: Unremarkable. ADRENAL GLANDS: Unremarkable. KIDNEYS, URETERS, AND BLADDER: Unremarkable. No hydronephrosis or nephrolithiasis. No ureteral or bladder calculi. STOMACH AND BOWEL: The stomach, duodenum, and small bowel are normal. No evidence of small bowel obstruction or inflammation. Fluid stool throughout the colon. There is mild inflammation along the mesenteric border of the proximal sigmoid colon. There is an enlarged diverticulum in this region on imaging 48 of series 4 and 64 of series 2. No perforation or abscess. The above left colon inflammation is new from most recent CT comparison. APPENDIX: Post appendectomy. PERITONEUM: No free fluid. No free air. LYMPH NODES: No lymphadenopathy. REPRODUCTIVE: The uterus is lobulated with a calcified leiomyoma on the left border. IUD in expected location. VASCULATURE: No aortic aneurysm. ABDOMINAL WALL AND SOFT TISSUES: Unremarkable. BONES: No fracture or suspicious osseous abnormality. IMPRESSION: 1. Acute uncomplicated sigmoid diverticulitis, new compared to prior CT, without perforation or abscess. 2. Lobulated uterus with calcified leiomyoma and IUD in expected position. Electronically signed by: Norleen Boxer MD 10/24/2024 02:17 PM EST RP Workstation: [id number]     Procedures   Medications Ordered  in the ED  morphine (PF) 4 MG/ML injection 4 mg (4 mg Intravenous Patient Refused/Not Given 10/24/24 1157)  ceFEPIme (MAXIPIME) 2 g in sodium chloride  0.9 % 100 mL IVPB (has no administration in time range)    And  metroNIDAZOLE (FLAGYL) IVPB 500 mg (has no administration in time range)  sodium chloride  0.9 % bolus 1,000 mL (0 mLs Intravenous Stopped 10/24/24 1316)  ondansetron  (ZOFRAN ) injection 4 mg (4 mg Intravenous Given 10/24/24 1150)  sodium chloride  0.9 % bolus 1,000 mL (1,000 mLs Intravenous New Bag/Given 10/24/24 1407)                                    Medical Decision Making Amount and/or Complexity of Data Reviewed Labs: ordered. Radiology: ordered.  Risk Prescription drug management. Decision regarding hospitalization.   This patient presents to the ED for concern of n/v/d, this involves an extensive number of treatment options, and is a complaint that carries with it a high risk of complications and morbidity.  The differential diagnosis includes chemo rxn, colitis, gastroenteritis   Co morbidities that complicate the patient evaluation  esophageal cancer (getting FLOT chemo and durvalumab  with last dose on 12/5), HTN, ADD, and Narcolepsy   Additional history obtained:  Additional history obtained from epic chart review    Lab Tests:  I Ordered, and personally interpreted labs.  The pertinent results include:  cbc nl; cmp with bun 29 and cr 1.82 (1.05 on 12/3); trop nl; ua ketones/protein   Imaging Studies ordered:  I ordered imaging studies including ct abd pelvis I independently visualized and interpreted imaging which showed  Acute uncomplicated sigmoid diverticulitis, new compared to prior CT,  without perforation or abscess.  2. Lobulated uterus with calcified leiomyoma and IUD in expected position.   I agree with the radiologist interpretation   Cardiac Monitoring:  The patient was maintained on a cardiac monitor.  I personally viewed and  interpreted the cardiac monitored which showed an underlying rhythm of: nsr   Medicines ordered and prescription drug management:  I ordered medication including ivfs/zofran   for sx  Reevaluation of the patient after these medicines showed that the patient improved I have  reviewed the patients home medicines and have made adjustments as needed   Test Considered:  ct   Critical Interventions:  ivfs   Consultations Obtained:  I requested consultation with the hospitalist (Dr. MYRTIS Blanch),  and discussed lab and imaging findings as well as pertinent plan - he will admit   Problem List / ED Course:  AKI and dehydration:  ivfs given Diverticulitis:  iv zosyn given Recent first chemo:  I am not sure if the diarrhea is from the diverticulitis or from the chemo.  EKG abn:  no cp.  Possibly due to stress from chemo.  Will continue to trend trops.   Reevaluation:  After the interventions noted above, I reevaluated the patient and found that they have :improved   Social Determinants of Health:  Lives at home   Dispostion:  After consideration of the diagnostic results and the patients response to treatment, I feel that the patent would benefit from admission.       Final diagnoses:  Dehydration  Diarrhea, unspecified type  AKI (acute kidney injury)  Diverticulitis  Esophageal carcinoma (HCC)  Adverse effect of chemotherapy, initial encounter  Abnormal EKG    ED Discharge Orders     None          Dean Clarity, MD 10/24/24 1455

## 2024-10-24 NOTE — ED Notes (Signed)
 [facility] called to transport the patient to [facility] rm[facility] [id number]

## 2024-10-24 NOTE — ED Notes (Signed)
 Pt aware of the need for a urine... Pt currently unable to provide a sample.SABRASABRASABRA

## 2024-10-24 NOTE — ED Notes (Signed)
 Pt refused Morphine... Provider informed.SABRASABRA

## 2024-10-25 ENCOUNTER — Other Ambulatory Visit: Payer: Self-pay

## 2024-10-25 DIAGNOSIS — K5792 Diverticulitis of intestine, part unspecified, without perforation or abscess without bleeding: Secondary | ICD-10-CM | POA: Diagnosis not present

## 2024-10-25 LAB — GASTROINTESTINAL PANEL BY PCR, STOOL (REPLACES STOOL CULTURE)

## 2024-10-25 LAB — CBC
HCT: 37.8 % (ref 36.0–46.0)
Hemoglobin: 12.6 g/dL (ref 12.0–15.0)
MCH: 30.7 pg (ref 26.0–34.0)
MCHC: 33.3 g/dL (ref 30.0–36.0)
MCV: 92 fL (ref 80.0–100.0)
Platelets: 215 K/uL (ref 150–400)
RBC: 4.11 MIL/uL (ref 3.87–5.11)
RDW: 13.5 % (ref 11.5–15.5)
WBC: 8.4 K/uL (ref 4.0–10.5)
nRBC: 0 % (ref 0.0–0.2)

## 2024-10-25 LAB — COMPREHENSIVE METABOLIC PANEL WITH GFR
ALT: 7 U/L (ref 0–44)
AST: 14 U/L — ABNORMAL LOW (ref 15–41)
Albumin: 3.4 g/dL — ABNORMAL LOW (ref 3.5–5.0)
Alkaline Phosphatase: 107 U/L (ref 38–126)
Anion gap: 13 (ref 5–15)
BUN: 22 mg/dL — ABNORMAL HIGH (ref 6–20)
CO2: 18 mmol/L — ABNORMAL LOW (ref 22–32)
Calcium: 8.1 mg/dL — ABNORMAL LOW (ref 8.9–10.3)
Chloride: 108 mmol/L (ref 98–111)
Creatinine, Ser: 1.34 mg/dL — ABNORMAL HIGH (ref 0.44–1.00)
GFR, Estimated: 47 mL/min — ABNORMAL LOW (ref >60)
Glucose, Bld: 93 mg/dL (ref 70–99)
Potassium: 3.2 mmol/L — ABNORMAL LOW (ref 3.5–5.1)
Sodium: 139 mmol/L (ref 135–145)
Total Bilirubin: <0.2 mg/dL (ref 0.0–1.2)
Total Protein: 5.9 g/dL — ABNORMAL LOW (ref 6.5–8.1)

## 2024-10-25 LAB — MAGNESIUM: Magnesium: 2.1 mg/dL (ref 1.7–2.4)

## 2024-10-25 LAB — HIV ANTIBODY (ROUTINE TESTING W REFLEX): HIV Screen 4th Generation wRfx: NONREACTIVE

## 2024-10-25 MED ORDER — ACETAMINOPHEN 325 MG PO TABS
650.0000 mg | ORAL_TABLET | Freq: Four times a day (QID) | ORAL | Status: DC | PRN
  Administered 2024-10-25 (×2): 650 mg via ORAL
  Filled 2024-10-25 (×2): qty 2

## 2024-10-25 MED ORDER — ACETAMINOPHEN 650 MG RE SUPP: 650.0000 mg | Freq: Four times a day (QID) | RECTAL | Status: DC | PRN

## 2024-10-25 MED ORDER — LACTATED RINGERS IV SOLN: INTRAVENOUS | Status: DC

## 2024-10-25 MED ORDER — OXYCODONE HCL 5 MG PO TABS
5.0000 mg | ORAL_TABLET | Freq: Once | ORAL | Status: AC
  Administered 2024-10-25: 5 mg via ORAL
  Filled 2024-10-25: qty 1

## 2024-10-25 MED ORDER — HYDROCODONE-ACETAMINOPHEN 5-325 MG PO TABS
1.0000 | ORAL_TABLET | Freq: Four times a day (QID) | ORAL | Status: DC | PRN
  Administered 2024-10-25: 1 via ORAL
  Filled 2024-10-25: qty 1

## 2024-10-25 MED ORDER — POTASSIUM CHLORIDE CRYS ER 20 MEQ PO TBCR
40.0000 meq | EXTENDED_RELEASE_TABLET | ORAL | Status: AC
  Administered 2024-10-25 (×2): 40 meq via ORAL
  Filled 2024-10-25 (×2): qty 2

## 2024-10-25 MED ADMIN — Metronidazole IV Soln 500 MG/100ML: 500 mg | INTRAVENOUS | NDC 00338105548

## 2024-10-25 NOTE — Plan of Care (Signed)
°  Problem: Education: Goal: Knowledge of General Education information will improve Description: Including pain rating scale, medication(s)/side effects and non-pharmacologic comfort measures Outcome: Progressing   Problem: Clinical Measurements: Goal: Respiratory complications will improve Outcome: Progressing Goal: Cardiovascular complication will be avoided Outcome: Progressing   Problem: Elimination: Goal: Will not experience complications related to urinary retention Outcome: Progressing   Problem: Skin Integrity: Goal: Risk for impaired skin integrity will decrease Outcome: Progressing   Problem: Nutrition: Goal: Adequate nutrition will be maintained Outcome: Not Progressing   Problem: Elimination: Goal: Will not experience complications related to bowel motility Outcome: Not Progressing   Problem: Pain Managment: Goal: General experience of comfort will improve and/or be controlled Outcome: Not Progressing

## 2024-10-25 NOTE — Plan of Care (Signed)
   Problem: Activity: Goal: Risk for activity intolerance will decrease Outcome: Progressing   Problem: Nutrition: Goal: Adequate nutrition will be maintained Outcome: Progressing   Problem: Coping: Goal: Level of anxiety will decrease Outcome: Progressing   Problem: Safety: Goal: Ability to remain free from injury will improve Outcome: Progressing   Problem: Skin Integrity: Goal: Risk for impaired skin integrity will decrease Outcome: Progressing

## 2024-10-25 NOTE — Plan of Care (Signed)
  Problem: Education: Goal: Knowledge of General Education information will improve Description: Including pain rating scale, medication(s)/side effects and non-pharmacologic comfort measures Outcome: Progressing   Problem: Health Behavior/Discharge Planning: Goal: Ability to manage health-related needs will improve Outcome: Progressing   Problem: Clinical Measurements: Goal: Respiratory complications will improve Outcome: Progressing Goal: Cardiovascular complication will be avoided Outcome: Progressing   Problem: Activity: Goal: Risk for activity intolerance will decrease Outcome: Progressing   Problem: Elimination: Goal: Will not experience complications related to bowel motility Outcome: Progressing Goal: Will not experience complications related to urinary retention Outcome: Progressing

## 2024-10-25 NOTE — Progress Notes (Signed)
°   10/25/24 1017  TOC Brief Assessment  Insurance and Status Reviewed  Patient has primary care physician Yes Ples, [place], [name])  Home environment has been reviewed Home alone  Prior level of function: Independent  Prior/Current Home Services No current home services  Social Drivers of Health Review SDOH reviewed no interventions necessary  Readmission risk has been reviewed Yes  Transition of care needs no transition of care needs at this time

## 2024-10-25 NOTE — Progress Notes (Signed)
 [facility] Progress Note Patient: Toni Dougherty FMW:[id number] DOB: 19-Aug-1971  DOA: 10/24/2024 DOS: the patient was seen and examined on 10/25/2024  Brief Hospital Course: Patient with PMH of HTN, esophageal cancer recently on chemotherapy, ADD presented to the hospital with complaints of abdominal pain and diarrhea. She also reports blood in the stool. Found to have diverticulitis. No prior episodes of bradycardias reported by the patient.  Assessment and plan. Acute diverticulitis of sigmoid colon. Possible diverticular bleed. Recently diagnosed with esophageal cancer and started on chemotherapy. Presents with abdominal pain. CT scan showing diverticulitis. Currently on IV antibiotic. C. difficile negative.  GI pathogen panel negative. Monitor.  Possible diverticular bleed. Patient reports that she has seen bright red blood per rectum.  Also reports some clot in the stool. Symptoms have been ongoing along with her diverticulitis. For now monitor H&H. Transfer for hemoglobin less than 7 or hemodynamic instability. Currently no indication for GI consultation given stability of H&H.  Acute kidney injury  Baseline serum creatinine normal. Creatinine on admission 1.8. Treated with IV fluid. Secondary to volume depletion.  Mild hypokalemia. Replaced.  History of esophageal cancer.   Undergoing chemotherapy.  History of ADD On Adderall at home.  Hyperlipidemia.   On Crestor .  Will hold for now  Hypertension.  Supposed to take amlodipine , Nebivolol  at home.  Will hold for now   Subjective: Abdominal pain still present but improving.  No nausea no vomiting.  Passing gas.  No BM.  Physical Exam: General: in Mild distress, No Rash Cardiovascular: S1 and S2 Present, No Murmur Respiratory: Good respiratory effort, Bilateral Air entry present. No Crackles, No wheezes Abdomen: Bowel Sound present, left lower quadrant tenderness Extremities: No edema Neuro: Alert and  oriented x3, no new focal deficit   Data Reviewed: I have Reviewed nursing notes, Vitals, and Lab results. Since last encounter, pertinent lab results CBC and BMP   . I have ordered test including CBC and BMP  .   Disposition: Status is: Inpatient Remains inpatient appropriate because: Monitor for improvement in diverticulitis symptoms.  Place and maintain sequential compression device Start: 10/25/24 1045 SCDs Start: 10/24/24 1740  Family Communication: No one at bedside Level of care: Telemetry   Vitals:   10/24/24 1735 10/24/24 2248 10/25/24 0223 10/25/24 0629  BP: (!) 141/86 122/79 (!) 148/88 (!) 142/98  Pulse: (!) 59 (!) 58 (!) 56 (!) 51  Resp: 16 18 18 18   Temp: 98.4 F (36.9 C) 98.6 F (37 C) 98.3 F (36.8 C) (!) 97.5 F (36.4 C)  TempSrc: Oral Oral Oral Oral  SpO2: 100% 100% 97% 99%  Weight:      Height:         Author: Yetta Blanch, MD 10/25/2024 6:38 PM  Please look on [email or web address] to find out who is on call.

## 2024-10-26 ENCOUNTER — Other Ambulatory Visit (HOSPITAL_BASED_OUTPATIENT_CLINIC_OR_DEPARTMENT_OTHER): Payer: Self-pay

## 2024-10-26 DIAGNOSIS — K5792 Diverticulitis of intestine, part unspecified, without perforation or abscess without bleeding: Secondary | ICD-10-CM | POA: Diagnosis not present

## 2024-10-26 LAB — COMPREHENSIVE METABOLIC PANEL WITH GFR
ALT: 8 U/L (ref 0–44)
AST: 16 U/L (ref 15–41)
Albumin: 3.5 g/dL (ref 3.5–5.0)
Alkaline Phosphatase: 232 U/L — ABNORMAL HIGH (ref 38–126)
Anion gap: 11 (ref 5–15)
BUN: 11 mg/dL (ref 6–20)
CO2: 21 mmol/L — ABNORMAL LOW (ref 22–32)
Calcium: 8.4 mg/dL — ABNORMAL LOW (ref 8.9–10.3)
Chloride: 108 mmol/L (ref 98–111)
Creatinine, Ser: 1.24 mg/dL — ABNORMAL HIGH (ref 0.44–1.00)
GFR, Estimated: 51 mL/min — ABNORMAL LOW (ref >60)
Glucose, Bld: 110 mg/dL — ABNORMAL HIGH (ref 70–99)
Potassium: 3.7 mmol/L (ref 3.5–5.1)
Sodium: 139 mmol/L (ref 135–145)
Total Bilirubin: <0.2 mg/dL (ref 0.0–1.2)
Total Protein: 6.1 g/dL — ABNORMAL LOW (ref 6.5–8.1)

## 2024-10-26 LAB — CBC WITH DIFFERENTIAL/PLATELET
Abs Immature Granulocytes: 0.43 K/uL — ABNORMAL HIGH (ref 0.00–0.07)
Basophils Absolute: 0.1 K/uL (ref 0.0–0.1)
Basophils Relative: 1 %
Eosinophils Absolute: 0 K/uL (ref 0.0–0.5)
Eosinophils Relative: 0 %
HCT: 39.2 % (ref 36.0–46.0)
Hemoglobin: 13.4 g/dL (ref 12.0–15.0)
Immature Granulocytes: 3 %
Lymphocytes Relative: 16 %
Lymphs Abs: 2.1 K/uL (ref 0.7–4.0)
MCH: 31.5 pg (ref 26.0–34.0)
MCHC: 34.2 g/dL (ref 30.0–36.0)
MCV: 92 fL (ref 80.0–100.0)
Monocytes Absolute: 1.1 K/uL — ABNORMAL HIGH (ref 0.1–1.0)
Monocytes Relative: 8 %
Neutro Abs: 9.7 K/uL — ABNORMAL HIGH (ref 1.7–7.7)
Neutrophils Relative %: 72 %
Platelets: 255 K/uL (ref 150–400)
RBC: 4.26 MIL/uL (ref 3.87–5.11)
RDW: 13.7 % (ref 11.5–15.5)
WBC: 13.5 K/uL — ABNORMAL HIGH (ref 4.0–10.5)
nRBC: 0.1 % (ref 0.0–0.2)

## 2024-10-26 LAB — MAGNESIUM: Magnesium: 2 mg/dL (ref 1.7–2.4)

## 2024-10-26 MED ORDER — AMLODIPINE BESYLATE 5 MG PO TABS
5.0000 mg | ORAL_TABLET | Freq: Every day | ORAL | Status: DC
  Administered 2024-10-26 – 2024-10-27 (×2): 5 mg via ORAL
  Filled 2024-10-26 (×2): qty 1

## 2024-10-26 MED ORDER — MORPHINE SULFATE (PF) 2 MG/ML IV SOLN: 1.0000 mg | INTRAVENOUS | Status: DC | PRN

## 2024-10-26 MED ORDER — SACCHAROMYCES BOULARDII 250 MG PO CAPS
250.0000 mg | ORAL_CAPSULE | Freq: Two times a day (BID) | ORAL | Status: DC
  Administered 2024-10-26 – 2024-10-27 (×3): 250 mg via ORAL
  Filled 2024-10-26 (×3): qty 1

## 2024-10-26 MED ORDER — OXYCODONE HCL 5 MG PO TABS: 5.0000 mg | ORAL_TABLET | ORAL | Status: DC | PRN

## 2024-10-26 MED ADMIN — Metronidazole IV Soln 500 MG/100ML: 500 mg | INTRAVENOUS | NDC 00338105548

## 2024-10-26 NOTE — Plan of Care (Signed)
  Problem: Education: Goal: Knowledge of General Education information will improve Description: Including pain rating scale, medication(s)/side effects and non-pharmacologic comfort measures Outcome: Progressing   Problem: Health Behavior/Discharge Planning: Goal: Ability to manage health-related needs will improve Outcome: Progressing   Problem: Clinical Measurements: Goal: Ability to maintain clinical measurements within normal limits will improve Outcome: Progressing Goal: Respiratory complications will improve Outcome: Progressing Goal: Cardiovascular complication will be avoided Outcome: Progressing   Problem: Clinical Measurements: Goal: Will remain free from infection Outcome: Not Progressing Goal: Diagnostic test results will improve Outcome: Not Progressing

## 2024-10-26 NOTE — Progress Notes (Signed)
 [facility] Progress Note Patient: Toni Dougherty FMW:[id number] DOB: 06-09-1971  DOA: 10/24/2024 DOS: the patient was seen and examined on 10/26/2024  Brief Hospital Course: Patient with PMH of HTN, esophageal cancer recently on chemotherapy, ADD presented to the hospital with complaints of abdominal pain and diarrhea. She also reports blood in the stool. Found to have diverticulitis. No prior episodes of bradycardias reported by the patient.  Assessment and plan. Acute diverticulitis of sigmoid colon. Possible diverticular bleed. Recently diagnosed with esophageal cancer and started on chemotherapy. Presents with abdominal pain. CT scan showing diverticulitis. Currently on IV antibiotic. C. difficile negative.  GI pathogen panel negative. Was on clear liquid diet.  At home generally takes liquid diet due to her esophageal cancer.  Will advance to soft diet and monitor. Continue pain control.  Possible diverticular bleed. Patient reports that she has seen bright red blood per rectum.  Also reports some clot in the stool. Symptoms have been ongoing along with her diverticulitis. For now monitor H&H. Transfer for hemoglobin less than 7 or hemodynamic instability. Currently no indication for GI consultation given stability of H&H.  Acute kidney injury  Baseline serum creatinine normal. Creatinine on admission 1.8. Treated with IV fluid. Secondary to volume depletion.  Mild hypokalemia. Replaced.  History of esophageal cancer.   Undergoing chemotherapy.  History of ADD On Adderall at home.  Hyperlipidemia.   On Crestor .  Will hold for now  Hypertension.  Continue home blood pressure medication.   Subjective: Reports 5-6 bowel movements yesterday.  Reports 3 bowel movements today.  So far no blood in the bowel today.  No nausea or vomiting.  Abdominal pain still present but improving.  Physical Exam: Clear to auscultation. Bowel sound present. Left lower quadrant  tenderness but improving. No edema.  Data Reviewed: I have Reviewed nursing notes, Vitals, and Lab results. Since last encounter, pertinent lab results CBC BMP   . I have ordered test including CBC and BMP  .   Disposition: Status is: Inpatient Remains inpatient appropriate because: Monitor for improvement in diet tolerance  Place and maintain sequential compression device Start: 10/25/24 1045 SCDs Start: 10/24/24 1740  Family Communication: No at bedside Level of care: Telemetry switch to MedSurg. Vitals:   10/26/24 0449 10/26/24 0746 10/26/24 1456 10/26/24 1604  BP: (!) 170/96 (!) 153/99 (!) 168/102 136/82  Pulse: (!) 54 (!) 56 64   Resp: 16 16 16    Temp: 97.6 F (36.4 C) 98 F (36.7 C) 98.3 F (36.8 C)   TempSrc: Oral Oral    SpO2: 96% 97% 91%   Weight:      Height:         Author: Yetta Blanch, MD 10/26/2024 5:05 PM  Please look on [email or web address] to find out who is on call.

## 2024-10-27 ENCOUNTER — Other Ambulatory Visit (HOSPITAL_COMMUNITY): Payer: Self-pay

## 2024-10-27 MED ORDER — SACCHAROMYCES BOULARDII 250 MG PO CAPS
250.0000 mg | ORAL_CAPSULE | Freq: Two times a day (BID) | ORAL | 0 refills | Status: AC
  Filled 2024-10-27: qty 60, 30d supply, fill #0

## 2024-10-27 MED ORDER — LOPERAMIDE HCL 2 MG PO CAPS: 2.0000 mg | ORAL_CAPSULE | ORAL | Status: DC | PRN

## 2024-10-27 MED ORDER — LOPERAMIDE HCL 2 MG PO CAPS: 2.0000 mg | ORAL_CAPSULE | ORAL | Status: AC | PRN

## 2024-10-27 MED ORDER — CIPROFLOXACIN HCL 500 MG PO TABS
500.0000 mg | ORAL_TABLET | Freq: Two times a day (BID) | ORAL | 0 refills | Status: AC
  Filled 2024-10-27: qty 8, 4d supply, fill #0

## 2024-10-27 MED ORDER — METRONIDAZOLE 500 MG PO TABS
500.0000 mg | ORAL_TABLET | Freq: Three times a day (TID) | ORAL | 0 refills | Status: AC
  Filled 2024-10-27: qty 12, 4d supply, fill #0

## 2024-10-27 MED ADMIN — Metronidazole IV Soln 500 MG/100ML: 500 mg | INTRAVENOUS | NDC 00338105548

## 2024-10-27 NOTE — Discharge Summary (Signed)
 Discharge Summary  Toni Dougherty FMW:[id number] DOB: [birth year]  PCP: Sheldon Netter, PA  Admit date: 10/24/2024 Discharge date: 10/27/2024  Recommendations for Outpatient Follow-up:  Please follow up with your PCP with CBC and BMP in 1-2 weeks. Patient has slightly elevated alkaline phosphatase, recommend repeat LFTs in the next 1 to 2 weeks.  This was also discussed with the patient at the time of discharge.  Discharge Diagnoses:  Active Hospital Problems   Diagnosis Date Noted   Diverticulitis 10/24/2024   AKI (acute kidney injury) 10/24/2024   Volume depletion 10/24/2024   Esophageal cancer (HCC) 10/03/2024   Hypokalemia 06/28/2023   Primary hypertension 09/23/2022    Resolved Hospital Problems  No resolved problems to display.   Discharge Condition: Stable   Diet recommendation: Diet Orders (From admission, onward)     Start     Ordered   10/26/24 1349  DIET SOFT Room service appropriate? Yes; Fluid consistency: Thin  Diet effective now       Question Answer Comment  Room service appropriate? Yes   Fluid consistency: Thin      10/26/24 1348           HPI and Brief Hospital Course:  53 year old female with past medical history of hypertension, recent diagnosis of esophageal cancer starting chemotherapy, ADD presented to the hospital with abdominal pain and diarrhea.  Also reported blood in the stool.  Found to have diverticulitis, started on empiric IV antibiotics and now tolerating her baseline soft diet.  Bleeding has stopped.  Still has some diarrhea only 5-6 bouts per day.  No fevers, no significant abdominal pain is tolerating diet and ready for discharge home from the hospital today.  She will complete a total 7-day course of empiric antibiotics for her diverticulitis.  Discharge details, plan of care and follow up instructions were discussed with patient and any available family or care providers. Patient and family are in agreement with discharge from the  hospital today and all questions were answered to their satisfaction.  Discharge Exam: BP (!) 156/100   Pulse 62   Temp 97.7 F (36.5 C) (Oral)   Resp 16   Ht (P) 6' (1.829 m)   Wt (P) 86.2 kg   LMP 09/28/2024 (Approximate)   SpO2 99%   BMI (P) 25.77 kg/m  General:  Alert, oriented, calm, in no acute distress  Eyes: EOMI, clear sclerea Neck: supple, no masses, trachea mildline  Cardiovascular: RRR, no murmurs or rubs, no peripheral edema  Respiratory: clear to auscultation bilaterally, no wheezes, no crackles  Abdomen: soft, nontender, nondistended, normal bowel tones heard  Skin: dry, no rashes  Musculoskeletal: no joint effusions, normal range of motion  Psychiatric: appropriate affect, normal speech  Neurologic: extraocular muscles intact, clear speech, moving all extremities with intact sensorium   Discharge Instructions You were cared for by a hospitalist during your hospital stay. If you have any questions about your discharge medications or the care you received while you were in the hospital after you are discharged, you can call the unit and asked to speak with the hospitalist on call if the hospitalist that took care of you is not available. Once you are discharged, your primary care physician will handle any further medical issues. Please note that NO REFILLS for any discharge medications will be authorized once you are discharged, as it is imperative that you return to your primary care physician (or establish a relationship with a primary care physician if you do not have one) for  your aftercare needs so that they can reassess your need for medications and monitor your lab values.   Allergies as of 10/27/2024       Reactions   Penicillins Hives   Erythromycin Nausea And Vomiting        Medication List     PAUSE taking these medications    rosuvastatin  20 MG tablet Wait to take this until your doctor or other care provider tells you to start again. Commonly  known as: Crestor  Take 1 tablet (20 mg total) by mouth daily.       STOP taking these medications    nebivolol  5 MG tablet Commonly known as: BYSTOLIC    nicotine  7 mg/24hr patch Commonly known as: NICODERM CQ  - dosed in mg/24 hr       TAKE these medications    amLODipine  5 MG tablet Commonly known as: NORVASC  Take 1 tablet (5 mg total) by mouth daily.   amphetamine -dextroamphetamine  30 MG tablet Commonly known as: ADDERALL 1 tablet Orally Twice a day (Fill date 10/15/2024)   ciprofloxacin  500 MG tablet Commonly known as: Cipro  Take 1 tablet (500 mg total) by mouth 2 (two) times daily for 4 days.   dexamethasone  4 MG tablet Commonly known as: DECADRON  Take 2 tablets (8 mg total) by mouth daily. Start the day after chemotherapy for 2 days. Take with food.   lidocaine -prilocaine  cream Commonly known as: EMLA  Apply to affected area once   loperamide  2 MG capsule Commonly known as: IMODIUM  Take 1 capsule (2 mg total) by mouth as needed for diarrhea or loose stools.   metroNIDAZOLE  500 MG tablet Commonly known as: FLAGYL  Take 1 tablet (500 mg total) by mouth 3 (three) times daily for 4 days.   ondansetron  8 MG tablet Commonly known as: ZOFRAN  Take 1 tablet (8 mg total) by mouth every 8 (eight) hours as needed for nausea or vomiting. Start on the third day after chemotherapy.   prochlorperazine  10 MG tablet Commonly known as: COMPAZINE  Take 1 tablet (10 mg total) by mouth every 6 (six) hours as needed for nausea or vomiting.   saccharomyces boulardii 250 MG capsule Commonly known as: FLORASTOR Take 1 capsule (250 mg total) by mouth 2 (two) times daily.   sucralfate  1 g tablet Commonly known as: CARAFATE  Take 1 tablet (1 g total) by mouth 2 (two) times daily on an empty stomach.   triamcinolone  cream 0.1 % Commonly known as: KENALOG  Apply to affected area(s) twice daily as needed       Allergies[1]   The results of significant diagnostics from this  hospitalization (including imaging, microbiology, ancillary and laboratory) are listed below for reference.    Significant Diagnostic Studies: CT ABDOMEN PELVIS WO CONTRAST Result Date: 10/24/2024 EXAM: CT Abdomen and Pelvis without Intravenous Contrast CLINICAL HISTORY: Abdominal pain, acute, nonlocalized; diarrhea; recent chemo. Pt endorses n/v/d starting yesterday. Also reports dizziness. Chemo tx last week. Pump removed Friday. * Tracking Code: BO * TECHNIQUE: Axial computed tomography images of the abdomen and pelvis without intravenous contrast. Dose reduction technique was used including one or more of the following: automated exposure control, adjustment of mA and kV according to patient size, and/or iterative reconstruction. CONTRAST: NONE COMPARISON: PET CT 10/11/2024, CT chest and pelvis 09/28/2024. FINDINGS: LUNG BASES: No basilar airspace consolidation or pleural effusion. LIVER: Unremarkable. GALLBLADDER AND BILE DUCTS: Unremarkable. No calcified stone. No ductal dilation. PANCREAS: Unremarkable. SPLEEN: Unremarkable. ADRENAL GLANDS: Unremarkable. KIDNEYS, URETERS, AND BLADDER: Unremarkable. No hydronephrosis or nephrolithiasis. No ureteral or  bladder calculi. STOMACH AND BOWEL: The stomach, duodenum, and small bowel are normal. No evidence of small bowel obstruction or inflammation. Fluid stool throughout the colon. There is mild inflammation along the mesenteric border of the proximal sigmoid colon. There is an enlarged diverticulum in this region on imaging 48 of series 4 and 64 of series 2. No perforation or abscess. The above left colon inflammation is new from most recent CT comparison. APPENDIX: Post appendectomy. PERITONEUM: No free fluid. No free air. LYMPH NODES: No lymphadenopathy. REPRODUCTIVE: The uterus is lobulated with a calcified leiomyoma on the left border. IUD in expected location. VASCULATURE: No aortic aneurysm. ABDOMINAL WALL AND SOFT TISSUES: Unremarkable. BONES: No  fracture or suspicious osseous abnormality. IMPRESSION: 1. Acute uncomplicated sigmoid diverticulitis, new compared to prior CT, without perforation or abscess. 2. Lobulated uterus with calcified leiomyoma and IUD in expected position. Electronically signed by: Norleen Boxer MD 10/24/2024 02:17 PM EST RP Workstation: [id number]   NM PET Image Initial (PI) Skull Base To Thigh Result Date: 10/14/2024 CLINICAL DATA:  Initial treatment strategy for esophageal cancer. EXAM: NUCLEAR MEDICINE PET SKULL BASE TO THIGH TECHNIQUE: 8.9 mCi F-18 FDG was injected intravenously. Full-ring PET imaging was performed from the skull base to thigh after the radiotracer. CT data was obtained and used for attenuation correction and anatomic localization. Fasting blood glucose: 111 mg/dl COMPARISON:  CT chest abdomen pelvis 09/28/2024. FINDINGS: Mediastinal blood pool activity: SUV max 2.7 Liver activity: SUV max NA NECK: Misregistration artifact.  No abnormal hypermetabolism. Incidental CT findings: None. CHEST: Hypermetabolic distal esophageal mass measures roughly 2.9 x 3.2 cm (4/94), SUV max 10.9. Adjacent hypermetabolic distal periesophageal lymph nodes measure up to 1.4 cm (4/93). No additional abnormal hypermetabolism. Incidental CT findings: Right IJ Port-A-Cath terminates in the right atrium. Atherosclerotic calcification of the aorta with age advanced involvement of all 3 coronary arteries. Heart is at the upper limits of normal in size to mildly enlarged. No pericardial effusion. Trace left pleural fluid. Bilateral dependent atelectasis. Similar nodularity in the right breast. ABDOMEN/PELVIS: No abnormal hypermetabolism. Incidental CT findings: Small low-attenuation lesion in the right kidney. No specific follow-up necessary. Intrauterine contraceptive device. Enlarged fibroid uterus. Tiny pelvic free fluid. SKELETON: Nodule in the medial right gluteus musculature measures 1.6 x 2.6 cm (4/193), SUV max 7.6. No abnormal  osseous hypermetabolism. Incidental CT findings: Degenerative changes in the spine. IMPRESSION: 1. Hypermetabolic distal esophageal carcinoma with hypermetabolic distal periesophageal lymph nodes. 2. Hypermetabolic nodule in the medial right gluteus musculature, worrisome for malignancy. 3.  Age advanced three-vessel coronary artery calcification. 4. Trace left pleural effusion. 5. Nodularity in the right breast. Recommend correlation with physical exam, mammography and possible ultrasound, as clinically indicated. 6. Enlarged fibroid uterus. 7.  Aortic atherosclerosis (ICD10-I70.0). Electronically Signed   By: Newell Eke M.D.   On: 10/14/2024 09:58   IR IMAGING GUIDED PORT INSERTION Result Date: 10/09/2024 INDICATION: 53 year old female with history of esophageal cancer requiring central venous access for chemotherapy administration. EXAM: IMPLANTED PORT A CATH PLACEMENT WITH ULTRASOUND AND FLUOROSCOPIC GUIDANCE COMPARISON:  None Available. MEDICATIONS: None. ANESTHESIA/SEDATION: Moderate (conscious) sedation was employed during this procedure. A total of Versed  3 mg and Fentanyl  100 mcg was administered intravenously. Moderate Sedation Time: 17 minutes. The patient's level of consciousness and vital signs were monitored continuously by radiology nursing throughout the procedure under my direct supervision. CONTRAST:  None FLUOROSCOPY TIME:  0 mGy reference air kerma COMPLICATIONS: None immediate. PROCEDURE: The procedure, risks, benefits, and alternatives were explained to the patient.  Questions regarding the procedure were encouraged and answered. The patient understands and consents to the procedure. The right neck and chest were prepped with chlorhexidine in a sterile fashion, and a sterile drape was applied covering the operative field. Maximum barrier sterile technique with sterile gowns and gloves were used for the procedure. A timeout was performed prior to the initiation of the procedure.  Ultrasound was used to examine the jugular vein which was compressible and free of internal echoes. A skin marker was used to demarcate the planned venotomy and port pocket incision sites. Local anesthesia was provided to these sites and the subcutaneous tunnel track with 1% lidocaine  with 1:100,000 epinephrine . A small incision was created at the jugular access site and blunt dissection was performed of the subcutaneous tissues. Under ultrasound guidance, the jugular vein was accessed with a 21 ga micropuncture needle and an 0.018 wire was inserted to the superior vena cava. Real-time ultrasound guidance was utilized for vascular access including the acquisition of a permanent ultrasound image documenting patency of the accessed vessel. A 5 Fr micopuncture set was then used, through which a 0.035 Rosen wire was passed under fluoroscopic guidance into the inferior vena cava. An 8 Fr dilator was then placed over the wire. A subcutaneous port pocket was then created along the upper chest wall utilizing a combination of sharp and blunt dissection. The pocket was irrigated with sterile saline, packed with gauze, and observed for hemorrhage. A single lumen clear view power injectable port was chosen for placement. The 8 Fr catheter was tunneled from the port pocket site to the venotomy incision. The port was placed in the pocket. The external catheter was trimmed to appropriate length. The dilator was exchanged for an 8 Fr peel-away sheath under fluoroscopic guidance. The catheter was then placed through the sheath and the sheath was removed. Final catheter positioning was confirmed and documented with a fluoroscopic spot radiograph. The port was accessed with a Huber needle, aspirated, and flushed with heparinized saline. The deep dermal layer of the port pocket incision was closed with interrupted 3-0 Vicryl suture. The skin was opposed with a running subcuticular 4-0 Monocryl suture. Dermabond was then placed over  the port pocket and neck incisions. The patient tolerated the procedure well without immediate post procedural complication. FINDINGS: After catheter placement, the tip lies within the superior cavoatrial junction. The catheter aspirates and flushes normally and is ready for immediate use. IMPRESSION: Successful placement of a power injectable Port-A-Cath via the right internal jugular vein. The catheter is ready for immediate use. Ester Sides, MD [facility] Electronically Signed   By: Ester Sides M.D.   On: 10/09/2024 16:07   CT CHEST ABDOMEN PELVIS W CONTRAST Result Date: 09/28/2024 EXAM: CT CHEST, ABDOMEN AND PELVIS WITH CONTRAST 09/28/2024 05:24:44 PM TECHNIQUE: CT of the chest, abdomen and pelvis was performed with the administration of 100 mL of iohexol  (OMNIPAQUE ) 300 MG/ML solution. Multiplanar reformatted images are provided for review. Automated exposure control, iterative reconstruction, and/or weight based adjustment of the mA/kV was utilized to reduce the radiation dose to as low as reasonably achievable. COMPARISON: None available. CLINICAL HISTORY: Metastatic disease evaluation. Malignant neoplasm of the lower third of the esophagus. FINDINGS: CHEST: MEDIASTINUM AND LYMPH NODES: Heart and pericardium are unremarkable. The central airways are clear. At least small volume hiatal hernia. Adjacent enlarged perigastric and paraesophageal lymph nodes measuring up to 1.4 cm (2:44). Associated adjacent paraesophageal nonenlarged prominent lymph nodes. Associated distal esophageal wall  thickening. Mild thoracic aorta atherosclerotic plaque. At least 2-vessel coronary artery calcifications. No hilar or axillary lymphadenopathy. Heterogeneous bilateral thyroid glands with up to 1.8 cm right thyroid gland hypodense nodule and heterogeneous enhancing 2.1 cm left thyroid gland nodule. LUNGS AND PLEURA: No pulmonary nodule or mass. No focal  consolidation or pulmonary edema. No pleural effusion or pneumothorax. ABDOMEN AND PELVIS: LIVER: The liver is unremarkable. GALLBLADDER AND BILE DUCTS: Gallbladder is unremarkable. No biliary ductal dilatation. SPLEEN: No acute abnormality. PANCREAS: No acute abnormality. ADRENAL GLANDS: No acute abnormality. KIDNEYS, URETERS AND BLADDER: Renal cortical scarring of the left kidney. No renal mass. No hydroureteronephrosis bilaterally. No nephroureterolithiasis bilaterally. No perinephric or periureteral stranding. Urinary bladder is unremarkable. GI AND BOWEL: Stomach demonstrates no acute abnormality. No small or large bowel wall thickening or dilation. Colonic diverticulosis. Status post appendectomy. Likely peristalsis of the hepatic flexure (3:69). Similar finding of the transverse colon (3:78). Underdistention of the sigmoid colon with no pericolonic fat stranding. There is no bowel obstruction. REPRODUCTIVE ORGANS: Markedly enlarged uterus with several uterine masses suggestive of fibroids. The uterus measures up to 14 x 7.5 cm. A T-shaped intrauterine device is noted within the expected region of the endometrium and may be low-lying. No definite adnexal mass identified with a likely degenerative peripherally calcified uterine fibroid extending along the left adnexa. PERITONEUM AND RETROPERITONEUM: No ascites. No free air. VASCULATURE: Aorta is normal in caliber. Mild abdominal aorta atherosclerotic plaque. ABDOMINAL AND PELVIS LYMPH NODES: No lymphadenopathy. BONES AND SOFT TISSUES: No suspicious lytic or blastic osseous lesion. No acute fracture. Bilateral nipple jewelry. There is a 2.5 x 1.5 cm fluid density lesion within the right breast (2:25). There is a 1.6 x 1.1 cm soft tissue density within the left breast (2:28). No focal soft tissue abnormality. IMPRESSION: 1. Small hiatal hernia with associated distal esophageal wall thickening. Enlarged perigastric and paraesophageal lymph nodes up to 1.4 cm.  Findings consistent with known malignancy. 2. Left thyroid nodule 2.1 cm and right thyroid nodule 1.8 cm. Recommend non-emergent thyroid ultrasound. 3. Markedly enlarged uterus with multiple fibroids; intrauterine device appears low-lying but within the endometrium. Consider pelvic us  for further evaluation. 4. A 2.5 x 1.5 cm fluid density lesion within the right breast. A 1.6 x 1.1 cm soft tissue density within the left breast. Recommend correlation with physical exam and consideration of a diagnostic mammogram if clinically indicated. Electronically signed by: Morgane Naveau MD 09/28/2024 06:03 PM EST RP Workstation: [id number]    Microbiology: Recent Results (from the past 240 hours)  Gastrointestinal Panel by PCR , Stool     Status: None   Collection Time: 10/24/24  3:16 PM   Specimen: Stool  Result Value Ref Range Status   Campylobacter species NOT DETECTED NOT DETECTED Final   Plesimonas shigelloides NOT DETECTED NOT DETECTED Final   Salmonella species NOT DETECTED NOT DETECTED Final   Yersinia enterocolitica NOT DETECTED NOT DETECTED Final   Vibrio species NOT DETECTED NOT DETECTED Final   Vibrio cholerae NOT DETECTED NOT DETECTED Final   Enteroaggregative E coli (EAEC) NOT DETECTED NOT DETECTED Final   Enteropathogenic E coli (EPEC) NOT DETECTED NOT DETECTED Final   Enterotoxigenic E coli (ETEC) NOT DETECTED NOT DETECTED Final   Shiga like toxin producing E coli (STEC) NOT DETECTED NOT DETECTED Final   Shigella/Enteroinvasive E coli (EIEC) NOT DETECTED NOT DETECTED Final   Cryptosporidium NOT DETECTED NOT DETECTED Final   Cyclospora cayetanensis NOT DETECTED NOT DETECTED Final   Entamoeba histolytica NOT DETECTED NOT  DETECTED Final   Giardia lamblia NOT DETECTED NOT DETECTED Final   Adenovirus F40/41 NOT DETECTED NOT DETECTED Final   Astrovirus NOT DETECTED NOT DETECTED Final   Norovirus GI/GII NOT DETECTED NOT DETECTED Final   Rotavirus A NOT DETECTED NOT DETECTED Final    Sapovirus (I, II, IV, and V) NOT DETECTED NOT DETECTED Final    Comment: Performed at [facility], [address], [place], [place] [address]  C Difficile Quick Screen w PCR reflex     Status: None   Collection Time: 10/24/24  3:16 PM   Specimen: Stool  Result Value Ref Range Status   C Diff antigen NEGATIVE NEGATIVE Final   C Diff toxin NEGATIVE NEGATIVE Final   C Diff interpretation No C. difficile detected.  Final    Comment: Performed at [facility], [address] [place], [place] [address]     Labs: Basic Metabolic Panel: Recent Labs  Lab 10/24/24 0935 10/25/24 0601 10/26/24 1052  NA 140 139 139  K 3.4* 3.2* 3.7  CL 100 108 108  CO2 25 18* 21*  GLUCOSE 102* 93 110*  BUN 29* 22* 11  CREATININE 1.82* 1.34* 1.24*  CALCIUM  9.7 8.1* 8.4*  MG  --  2.1 2.0   Liver Function Tests: Recent Labs  Lab 10/24/24 0935 10/25/24 0601 10/26/24 1052  AST 18 14* 16  ALT 12 7 8   ALKPHOS 112 107 232*  BILITOT 0.4 <0.2 <0.2  PROT 6.9 5.9* 6.1*  ALBUMIN 4.1 3.4* 3.5   Recent Labs  Lab 10/24/24 0935  LIPASE 16   No results for input(s): AMMONIA in the last 168 hours. CBC: Recent Labs  Lab 10/24/24 0935 10/25/24 0601 10/26/24 1052  WBC 10.2 8.4 13.5*  NEUTROABS 6.9  --  9.7*  HGB 14.2 12.6 13.4  HCT 41.1 37.8 39.2  MCV 90.1 92.0 92.0  PLT 218 215 255   Cardiac Enzymes: No results for input(s): CKTOTAL, CKMB, CKMBINDEX, TROPONINI in the last 168 hours. BNP: BNP (last 3 results) No results for input(s): BNP in the last 8760 hours.  ProBNP (last 3 results) No results for input(s): PROBNP in the last 8760 hours.  CBG: No results for input(s): GLUCAP in the last 168 hours.  Time spent: > 30 minutes were spent in preparing this discharge including medication reconciliation, counseling, and coordination of care.  Signed:  Sierrah Luevano CHRISTELLA Gail, MD  [facility] 10/27/2024, 9:35 AM     [1]  Allergies Allergen Reactions    Penicillins Hives   Erythromycin Nausea And Vomiting

## 2024-10-27 NOTE — Plan of Care (Signed)
 Gave pt discharge instructions, she verbalized her understanding, PIV intact upon removal, vitals stable, pt going by private vehicle.  Problem: Education: Goal: Knowledge of General Education information will improve Description: Including pain rating scale, medication(s)/side effects and non-pharmacologic comfort measures Outcome: Adequate for Discharge   Problem: Health Behavior/Discharge Planning: Goal: Ability to manage health-related needs will improve Outcome: Adequate for Discharge   Problem: Clinical Measurements: Goal: Ability to maintain clinical measurements within normal limits will improve Outcome: Adequate for Discharge Goal: Will remain free from infection Outcome: Adequate for Discharge Goal: Diagnostic test results will improve Outcome: Adequate for Discharge Goal: Respiratory complications will improve Outcome: Adequate for Discharge Goal: Cardiovascular complication will be avoided Outcome: Adequate for Discharge   Problem: Activity: Goal: Risk for activity intolerance will decrease Outcome: Adequate for Discharge   Problem: Nutrition: Goal: Adequate nutrition will be maintained Outcome: Adequate for Discharge   Problem: Coping: Goal: Level of anxiety will decrease Outcome: Adequate for Discharge   Problem: Elimination: Goal: Will not experience complications related to bowel motility Outcome: Adequate for Discharge Goal: Will not experience complications related to urinary retention Outcome: Adequate for Discharge   Problem: Pain Managment: Goal: General experience of comfort will improve and/or be controlled Outcome: Adequate for Discharge   Problem: Safety: Goal: Ability to remain free from injury will improve Outcome: Adequate for Discharge   Problem: Skin Integrity: Goal: Risk for impaired skin integrity will decrease Outcome: Adequate for Discharge

## 2024-10-28 ENCOUNTER — Ambulatory Visit: Admitting: Podiatry

## 2024-10-28 ENCOUNTER — Other Ambulatory Visit (HOSPITAL_COMMUNITY): Payer: Self-pay

## 2024-10-29 ENCOUNTER — Other Ambulatory Visit (HOSPITAL_BASED_OUTPATIENT_CLINIC_OR_DEPARTMENT_OTHER): Payer: Self-pay

## 2024-10-29 MED ORDER — AMLODIPINE BESYLATE 5 MG PO TABS
10.0000 mg | ORAL_TABLET | Freq: Every day | ORAL | 0 refills | Status: AC
  Filled 2024-10-29: qty 180, 90d supply, fill #0

## 2024-10-31 NOTE — Assessment & Plan Note (Signed)
 cT3N2M0, MMR normal, Her2 (-), PD-L1 CPS 100% - Patient presented with progressive dysphagia and a 50 pound weight loss -EGD showed a large ulcerated, partially obstructed mass in the distal esophagus, extending into GE junction and gastric cardia, biopsy showed poorly differentiated adenocarcinoma. -PET scan was denied by insurance, CT showed enlarged mediastinal adenopathy, US  staging T3 N2 -I recommend neoadjuvant chemotherapy FLOT and durvalumab , she started on 10/17/2024

## 2024-11-01 ENCOUNTER — Inpatient Hospital Stay: Admitting: Dietician

## 2024-11-01 ENCOUNTER — Inpatient Hospital Stay

## 2024-11-01 ENCOUNTER — Other Ambulatory Visit (HOSPITAL_BASED_OUTPATIENT_CLINIC_OR_DEPARTMENT_OTHER): Payer: Self-pay

## 2024-11-01 ENCOUNTER — Other Ambulatory Visit: Payer: Self-pay

## 2024-11-01 ENCOUNTER — Inpatient Hospital Stay: Admitting: Hematology

## 2024-11-01 VITALS — BP 138/88 | HR 79 | Temp 97.8°F | Resp 16 | Ht 72.0 in | Wt 177.7 lb

## 2024-11-01 DIAGNOSIS — C155 Malignant neoplasm of lower third of esophagus: Secondary | ICD-10-CM

## 2024-11-01 DIAGNOSIS — E876 Hypokalemia: Secondary | ICD-10-CM

## 2024-11-01 DIAGNOSIS — F988 Other specified behavioral and emotional disorders with onset usually occurring in childhood and adolescence: Secondary | ICD-10-CM | POA: Diagnosis not present

## 2024-11-01 DIAGNOSIS — D649 Anemia, unspecified: Secondary | ICD-10-CM

## 2024-11-01 DIAGNOSIS — Z79899 Other long term (current) drug therapy: Secondary | ICD-10-CM | POA: Diagnosis not present

## 2024-11-01 DIAGNOSIS — N179 Acute kidney failure, unspecified: Secondary | ICD-10-CM | POA: Diagnosis not present

## 2024-11-01 DIAGNOSIS — G47419 Narcolepsy without cataplexy: Secondary | ICD-10-CM | POA: Diagnosis not present

## 2024-11-01 DIAGNOSIS — R634 Abnormal weight loss: Secondary | ICD-10-CM | POA: Diagnosis not present

## 2024-11-01 DIAGNOSIS — Z5112 Encounter for antineoplastic immunotherapy: Secondary | ICD-10-CM | POA: Diagnosis not present

## 2024-11-01 DIAGNOSIS — Z7952 Long term (current) use of systemic steroids: Secondary | ICD-10-CM | POA: Diagnosis not present

## 2024-11-01 DIAGNOSIS — K222 Esophageal obstruction: Secondary | ICD-10-CM | POA: Diagnosis not present

## 2024-11-01 DIAGNOSIS — I10 Essential (primary) hypertension: Secondary | ICD-10-CM | POA: Diagnosis not present

## 2024-11-01 DIAGNOSIS — C771 Secondary and unspecified malignant neoplasm of intrathoracic lymph nodes: Secondary | ICD-10-CM | POA: Diagnosis not present

## 2024-11-01 DIAGNOSIS — Z5111 Encounter for antineoplastic chemotherapy: Secondary | ICD-10-CM | POA: Diagnosis not present

## 2024-11-01 DIAGNOSIS — K521 Toxic gastroenteritis and colitis: Secondary | ICD-10-CM | POA: Diagnosis not present

## 2024-11-01 DIAGNOSIS — L659 Nonscarring hair loss, unspecified: Secondary | ICD-10-CM | POA: Diagnosis not present

## 2024-11-01 LAB — CMP (CANCER CENTER ONLY)
ALT: 16 U/L (ref 0–44)
AST: 23 U/L (ref 15–41)
Albumin: 4.1 g/dL (ref 3.5–5.0)
Alkaline Phosphatase: 78 U/L (ref 38–126)
Anion gap: 15 (ref 5–15)
BUN: 16 mg/dL (ref 6–20)
CO2: 20 mmol/L — ABNORMAL LOW (ref 22–32)
Calcium: 8.8 mg/dL — ABNORMAL LOW (ref 8.9–10.3)
Chloride: 104 mmol/L (ref 98–111)
Creatinine: 2.15 mg/dL — ABNORMAL HIGH (ref 0.44–1.00)
GFR, Estimated: 26 mL/min — ABNORMAL LOW
Glucose, Bld: 99 mg/dL (ref 70–99)
Potassium: 3 mmol/L — ABNORMAL LOW (ref 3.5–5.1)
Sodium: 139 mmol/L (ref 135–145)
Total Bilirubin: 0.4 mg/dL (ref 0.0–1.2)
Total Protein: 7.1 g/dL (ref 6.5–8.1)

## 2024-11-01 LAB — CBC WITH DIFFERENTIAL (CANCER CENTER ONLY)
Abs Immature Granulocytes: 0.38 K/uL — ABNORMAL HIGH (ref 0.00–0.07)
Basophils Absolute: 0.1 K/uL (ref 0.0–0.1)
Basophils Relative: 1 %
Eosinophils Absolute: 0 K/uL (ref 0.0–0.5)
Eosinophils Relative: 0 %
HCT: 39.8 % (ref 36.0–46.0)
Hemoglobin: 13.9 g/dL (ref 12.0–15.0)
Immature Granulocytes: 4 %
Lymphocytes Relative: 29 %
Lymphs Abs: 2.8 K/uL (ref 0.7–4.0)
MCH: 30.6 pg (ref 26.0–34.0)
MCHC: 34.9 g/dL (ref 30.0–36.0)
MCV: 87.7 fL (ref 80.0–100.0)
Monocytes Absolute: 0.7 K/uL (ref 0.1–1.0)
Monocytes Relative: 7 %
Neutro Abs: 5.9 K/uL (ref 1.7–7.7)
Neutrophils Relative %: 59 %
Platelet Count: 202 K/uL (ref 150–400)
RBC: 4.54 MIL/uL (ref 3.87–5.11)
RDW: 14.5 % (ref 11.5–15.5)
WBC Count: 9.9 K/uL (ref 4.0–10.5)
nRBC: 0 % (ref 0.0–0.2)

## 2024-11-01 LAB — FERRITIN: Ferritin: 204 ng/mL (ref 11–307)

## 2024-11-01 LAB — MAGNESIUM: Magnesium: 1.8 mg/dL (ref 1.7–2.4)

## 2024-11-01 LAB — CEA (ACCESS): CEA (CHCC): 6.59 ng/mL — ABNORMAL HIGH (ref 0.00–5.00)

## 2024-11-01 MED ORDER — POTASSIUM CHLORIDE 10 MEQ/100ML IV SOLN
10.0000 meq | Freq: Once | INTRAVENOUS | Status: AC
  Administered 2024-11-01: 10 meq via INTRAVENOUS
  Filled 2024-11-01: qty 100

## 2024-11-01 MED ORDER — SODIUM CHLORIDE 0.9 % IV SOLN: INTRAVENOUS | Status: DC

## 2024-11-01 MED ORDER — SODIUM CHLORIDE 0.9 % IV SOLN
1500.0000 mL | Freq: Once | INTRAVENOUS | Status: AC
  Administered 2024-11-01: 1500 mL via INTRAVENOUS

## 2024-11-01 MED ORDER — POTASSIUM CHLORIDE CRYS ER 20 MEQ PO TBCR
20.0000 meq | EXTENDED_RELEASE_TABLET | Freq: Once | ORAL | Status: AC
  Administered 2024-11-01: 20 meq via ORAL
  Filled 2024-11-01: qty 1

## 2024-11-01 MED ORDER — DIPHENOXYLATE-ATROPINE 2.5-0.025 MG PO TABS
1.0000 | ORAL_TABLET | Freq: Four times a day (QID) | ORAL | 1 refills | Status: AC | PRN
  Filled 2024-11-01: qty 60, 8d supply, fill #0

## 2024-11-01 MED ORDER — SODIUM CHLORIDE 0.9 % IV SOLN: INTRAVENOUS | Status: AC

## 2024-11-01 MED ORDER — SODIUM CHLORIDE 0.9 % IV SOLN: Freq: Once | INTRAVENOUS | Status: DC

## 2024-11-01 MED ORDER — POTASSIUM CHLORIDE CRYS ER 20 MEQ PO TBCR
20.0000 meq | EXTENDED_RELEASE_TABLET | Freq: Two times a day (BID) | ORAL | 1 refills | Status: AC
  Filled 2024-11-01: qty 30, 15d supply, fill #0
  Filled 2024-11-26: qty 30, 15d supply, fill #1

## 2024-11-01 MED ORDER — POTASSIUM CHLORIDE 10 MEQ/100ML IV SOLN: 10.0000 meq | INTRAVENOUS | Status: DC

## 2024-11-01 NOTE — Progress Notes (Signed)
 Nutrition Follow-up:  Pt with malignant neoplasm of lower third of esophagus. Receiving chemotherapy with FLOT q14d + durvalumab  q28d (start 12/4). Patient is under the care of Dr. Lanny.   12/11-12/14 admission with diverticulitis - treatment held today, receiving IVF  Met with patient in infusion. Reports tolerating therapy well. Had mild cold sensitivity that resolved quickly. Says she was feeling okay until diarrhea began Wednesday night. Reports doing some better since discharge. Continue to have multiple episodes of watery diarrhea, although it is beginning to thicken up. She is drinking an Ensure Clear at visit which she likes. Patient eating applesauce and has been able to tolerate mashed potatoes with gravy a few times this week. Noting she was unable to get these down prior to starting therapy.    Medications: reviewed   Labs: K 3.0, Cr 2.15  Anthropometrics: Wt 177 lb 11.2 oz today decreased 7% (14 lbs) in 2 weeks - this is severe for time frame  12/3 - 191 lb 11.2 oz    NUTRITION DIAGNOSIS: Unintended wt loss continues    INTERVENTION:  Discussed strategies for diarrhea, foods best tolerated and foods to avoid - handout provided Encourage foods including banana's, rice, applesauce, potatoes to add bulk - suggested adding psyllium fiber (Metamucil) - recipe for rice porridge provided  Continue juice type ONS - samples of Boost breeze + coupons Support and encouragement    MONITORING, EVALUATION, GOAL: wt trends, intake   NEXT VISIT: Friday January 2 during infusion

## 2024-11-01 NOTE — Progress Notes (Signed)
 " [facility]   Telephone:([phone] [phone]) [phone]   Clinic Follow up Note   Patient Care Team: Sheldon Netter, [name] as PCP - General (Physician Assistant)  Date of Service:  11/01/2024  CHIEF COMPLAINT: f/u of esophageal cancer   CURRENT THERAPY:  Neoadjuvant chemo FLOT and durvalumab    Oncology History   Esophageal cancer (HCC) cT3N2M0, MMR normal, Her2 (-), PD-L1 CPS 100% - Patient presented with progressive dysphagia and a 50 pound weight loss -EGD showed a large ulcerated, partially obstructed mass in the distal esophagus, extending into GE junction and gastric cardia, biopsy showed poorly differentiated adenocarcinoma. -PET scan was denied by insurance, CT showed enlarged mediastinal adenopathy, US  staging T3 N2 -I recommend neoadjuvant chemotherapy FLOT and durvalumab , she started on 10/17/2024  Assessment & Plan Malignant neoplasm of lower third of esophagus She is undergoing chemotherapy for esophageal carcinoma. The most recent cycle was complicated by severe diarrhea and acute kidney injury, resulting in a delay of chemotherapy. Hematologic recovery has occurred, and she is tolerating other side effects, including mild cold sensitivity and alopecia, without significant distress. No new pain or abdominal cramping. Dose reduction of docetaxel  is planned to improve tolerability and decrease risk of recurrent severe diarrhea. She was counseled regarding increased risk of poor wound healing and infection during chemotherapy, particularly in relation to podiatric procedures. - Postponed chemotherapy for one week due to acute kidney injury and ongoing diarrhea. - Planned 30% dose reduction of docetaxel  for the next cycle to minimize risk of recurrent severe diarrhea. - Will reschedule chemotherapy following reassessment of renal function. - Provided anticipatory guidance regarding wound healing and infection risk during chemotherapy, especially concerning  podiatric procedures. - Arranged follow-up laboratory evaluation next Monday to reassess renal function and determine readiness for next chemotherapy cycle.  Chemotherapy-induced diarrhea with acute kidney injury and hypokalemia She developed severe diarrhea following docetaxel  administration, resulting in dehydration, acute kidney injury, and hypokalemia. Infectious workup was negative. Diarrhea is improving but persists, with ongoing risk for further renal impairment and electrolyte disturbances. She has difficulty tolerating oral intake and requires continued intravenous electrolyte and fluid support. No fever, abdominal pain, or cramping. Reported hematochezia during the episode, with no prior diverticulitis. - Administered intravenous fluids for rehydration and renal support. - Administered potassium supplementation for hypokalemia. - Ordered oral potassium supplementation for home use while diarrhea persists. - Ordered magnesium level with follow-up labs. - Prescribed Lomotil  (60 tablets with one refill) for diarrhea management, to be taken as soon as stools become watery, 1-2 tablets up to four times daily as needed. - Instructed her to obtain and use Imodium  as needed for diarrhea. - Instructed her to increase oral fluid intake, including electrolyte solutions. - Arranged follow-up laboratory evaluation next Monday to reassess renal function and electrolytes.  Plan - Recent hospital admission reviewed.  Patient is still recovering from first cycle chemotherapy with residual diarrhea  lab reviewed, worsening Cr, will hold chemotherapy today, and give IV fluids and IV/oral potassium - Postpone chemotherapy for a week - Will reduce docetaxel  dose due to severe diarrhea - Follow-up before next cycle chemo   SUMMARY OF ONCOLOGIC HISTORY: Oncology History  Esophageal cancer (HCC)  09/18/2024 Cancer Staging   Staging form: Esophagus - Adenocarcinoma, AJCC 8th Edition - Clinical stage from  09/18/2024: Stage IVA (cT3, cN2, cM0, G3) - Signed by Lanny Callander, MD on 10/03/2024 Histologic grading system: 3 grade system   10/03/2024 Initial Diagnosis   Esophageal cancer (HCC)   10/17/2024 -  Chemotherapy   Patient is on Treatment Plan : GASTROESOPHAGEAL Durvalumab  q28d + FLOT q14d / Durvalumab  q28d        Discussed the use of AI scribe software for clinical note transcription with the patient, who gave verbal consent to proceed.  History of Present Illness Toni Dougherty is a 53 year old female with malignant neoplasm of the lower third of the esophagus receiving docetaxel -based chemotherapy who presents for follow-up after hospitalization for severe chemotherapy-induced diarrhea with associated acute kidney injury and hypokalemia.  She completed a cycle of docetaxel  on December 4 and 5. Five to six days later she developed severe, persistent watery diarrhea, including one episode of incontinence at the clinic entrance, with hematochezia but no fever, abdominal pain, or cramping. Symptoms have only recently begun to improve and remain ongoing, and she still has difficulty maintaining hydration.  During hospitalization she was told she had folliculitis and an infectious workup including C. difficile and GI panel was negative. She has no known diverticulitis.  She has been trying Pedialyte and rice water, but diarrhea has limited effective oral hydration. She has not used loperamide  or diphenoxylate -atropine  because these were discouraged inpatient. She has not been taking oral potassium consistently but is willing to resume. She denies significant nausea, pain, or cramping. She has mild but tolerable cold sensitivity and alopecia without distress. She reports no new or worsening symptoms.  She is concerned about wound healing and infection risk from podiatric treatment of ingrown toenails while on cytotoxic therapy.     All other systems were reviewed with the patient and are  negative.  MEDICAL HISTORY:  Past Medical History:  Diagnosis Date   ADD (attention deficit disorder)    Cancer (HCC)    HTN (hypertension)    Narcolepsy     SURGICAL HISTORY: Past Surgical History:  Procedure Laterality Date   APPENDECTOMY     ESOPHAGOGASTRODUODENOSCOPY N/A 10/02/2024   Procedure: EGD (ESOPHAGOGASTRODUODENOSCOPY);  Surgeon: Burnette Fallow, MD;  Location: [facility];  Service: Gastroenterology;  Laterality: N/A;   EUS N/A 10/02/2024   Procedure: ULTRASOUND, UPPER GI TRACT, ENDOSCOPIC;  Surgeon: Burnette Fallow, MD;  Location: [facility];  Service: Gastroenterology;  Laterality: N/A;   IR IMAGING GUIDED PORT INSERTION  10/09/2024   rotate cuff Right 2012    I have reviewed the social history and family history with the patient and they are unchanged from previous note.  ALLERGIES:  is allergic to penicillins and erythromycin.  MEDICATIONS:  Current Outpatient Medications  Medication Sig Dispense Refill   diphenoxylate -atropine  (LOMOTIL ) 2.5-0.025 MG tablet Take 1-2 tablets by mouth 4 (four) times daily as needed for diarrhea or loose stools. 60 tablet 1   potassium chloride  SA (KLOR-CON  M) 20 MEQ tablet Take 1 tablet (20 mEq total) by mouth 2 (two) times daily. 30 tablet 1   amLODipine  (NORVASC ) 5 MG tablet Take 1 tablet (5 mg total) by mouth daily. (Patient not taking: Reported on 10/25/2024) 90 tablet 1   amLODipine  (NORVASC ) 5 MG tablet Take 2 tablets (10 mg total) by mouth daily. 180 tablet 0   amphetamine -dextroamphetamine  (ADDERALL) 30 MG tablet 1 tablet Orally Twice a day (Fill date 10/15/2024) 60 tablet 0   dexamethasone  (DECADRON ) 4 MG tablet Take 2 tablets (8 mg total) by mouth daily. Start the day after chemotherapy for 2 days. Take with food. 30 tablet 1   lidocaine -prilocaine  (EMLA ) cream Apply to affected area once 30 g 3   loperamide  (IMODIUM ) 2 MG capsule Take 1 capsule (2 mg total)  by mouth as needed for diarrhea or loose stools.      ondansetron  (ZOFRAN ) 8 MG tablet Take 1 tablet (8 mg total) by mouth every 8 (eight) hours as needed for nausea or vomiting. Start on the third day after chemotherapy. 30 tablet 1   prochlorperazine  (COMPAZINE ) 10 MG tablet Take 1 tablet (10 mg total) by mouth every 6 (six) hours as needed for nausea or vomiting. 30 tablet 1   [Paused] rosuvastatin  (CRESTOR ) 20 MG tablet Take 1 tablet (20 mg total) by mouth daily. (Patient not taking: Reported on 10/25/2024) 90 tablet 1   saccharomyces boulardii (FLORASTOR) 250 MG capsule Take 1 capsule (250 mg total) by mouth 2 (two) times daily. 60 capsule 0   sucralfate  (CARAFATE ) 1 g tablet Take 1 tablet (1 g total) by mouth 2 (two) times daily on an empty stomach. 60 tablet 1   triamcinolone  cream (KENALOG ) 0.1 % Apply to affected area(s) twice daily as needed 30 g 1   No current facility-administered medications for this visit.    PHYSICAL EXAMINATION: ECOG PERFORMANCE STATUS: 2 - Symptomatic, <50% confined to bed  Vitals:   11/01/24 0900  BP: 138/88  Pulse: 79  Resp: 16  Temp: 97.8 F (36.6 C)  SpO2: 99%   Wt Readings from Last 3 Encounters:  11/01/24 177 lb 11.2 oz (80.6 kg)  10/24/24 (P) 190 lb (86.2 kg)  10/16/24 191 lb 11.2 oz (87 kg)     GENERAL:alert, no distress and comfortable SKIN: skin color, texture, turgor are normal, no rashes or significant lesions EYES: normal, Conjunctiva are pink and non-injected, sclera clear NECK: supple, thyroid normal size, non-tender, without nodularity LYMPH:  no palpable lymphadenopathy in the cervical, axillary  LUNGS: clear to auscultation and percussion with normal breathing effort HEART: regular rate & rhythm and no murmurs and no lower extremity edema ABDOMEN:abdomen soft, non-tender and normal bowel sounds Musculoskeletal:no cyanosis of digits and no clubbing  NEURO: alert & oriented x 3 with fluent speech, no focal motor/sensory deficits  Physical Exam    LABORATORY DATA:  I have  reviewed the data as listed    Latest Ref Rng & Units 11/01/2024    9:31 AM 10/26/2024   10:52 AM 10/25/2024    6:01 AM  CBC  WBC 4.0 - 10.5 K/uL 9.9  13.5  8.4   Hemoglobin 12.0 - 15.0 g/dL 86.0  86.5  87.3   Hematocrit 36.0 - 46.0 % 39.8  39.2  37.8   Platelets 150 - 400 K/uL 202  255  215         Latest Ref Rng & Units 11/01/2024    9:31 AM 10/26/2024   10:52 AM 10/25/2024    6:01 AM  CMP  Glucose 70 - 99 mg/dL 99  889  93   BUN 6 - 20 mg/dL 16  11  22    Creatinine 0.44 - 1.00 mg/dL 7.84  8.75  8.65   Sodium 135 - 145 mmol/L 139  139  139   Potassium 3.5 - 5.1 mmol/L 3.0  3.7  3.2   Chloride 98 - 111 mmol/L 104  108  108   CO2 22 - 32 mmol/L 20  21  18    Calcium  8.9 - 10.3 mg/dL 8.8  8.4  8.1   Total Protein 6.5 - 8.1 g/dL 7.1  6.1  5.9   Total Bilirubin 0.0 - 1.2 mg/dL 0.4  <9.7  <9.7   Alkaline Phos 38 - 126 U/L 78  232  107   AST 15 - 41 U/L 23  16  14    ALT 0 - 44 U/L 16  8  7        RADIOGRAPHIC STUDIES: I have personally reviewed the radiological images as listed and agreed with the findings in the report. No results found.    Orders Placed This Encounter  Procedures   CBC with Differential (Cancer Center Only)    Standing Status:   Future    Expected Date:   11/08/2024    Expiration Date:   11/08/2025   CMP (Cancer Center only)    Standing Status:   Future    Expected Date:   11/08/2024    Expiration Date:   11/08/2025   Magnesium    Standing Status:   Standing    Number of Occurrences:   20    Expiration Date:   11/01/2025   All questions were answered. The patient knows to call the clinic with any problems, questions or concerns. No barriers to learning was detected. The total time spent in the appointment was 40 minutes, including review of chart and various tests results, discussions about plan of care and coordination of care plan     Onita Mattock, MD 11/01/2024     "

## 2024-11-01 NOTE — Patient Instructions (Signed)

## 2024-11-02 ENCOUNTER — Inpatient Hospital Stay

## 2024-11-05 ENCOUNTER — Encounter: Payer: Self-pay | Admitting: Hematology

## 2024-11-05 ENCOUNTER — Other Ambulatory Visit: Payer: Self-pay | Admitting: Hematology

## 2024-11-05 ENCOUNTER — Other Ambulatory Visit: Payer: Self-pay | Admitting: Radiology

## 2024-11-05 DIAGNOSIS — R222 Localized swelling, mass and lump, trunk: Secondary | ICD-10-CM

## 2024-11-05 NOTE — H&P (Signed)
 "  Chief Complaint: Esophageal cancer; hypermetabolic nodule right gluteal musculature; referred for image guided biopsy of right gluteal nodule  Referring Provider(s): Feng,Y  Supervising Physician: Vanice Revel  Patient Status: [facility] - Out-pt  History of Present Illness: Toni Dougherty is a 53 y.o. female smoker with past medical history significant for ADD, hypertension, narcolepsy who presents now with recently diagnosed esophageal carcinoma and  PET scan revealing:  1. Hypermetabolic distal esophageal carcinoma with hypermetabolic distal periesophageal lymph nodes. 2. Hypermetabolic nodule in the medial right gluteus musculature, worrisome for malignancy. 3.  Age advanced three-vessel coronary artery calcification. 4. Trace left pleural effusion. 5. Nodularity in the right breast. Recommend correlation with physical exam, mammography and possible ultrasound, as clinically indicated. 6. Enlarged fibroid uterus. 7.  Aortic atherosclerosis  She is scheduled today for image guided biopsy of the right gluteal musculature nodule.   Patient is Full Code  Past Medical History:  Diagnosis Date   ADD (attention deficit disorder)    Cancer (HCC)    HTN (hypertension)    Narcolepsy     Past Surgical History:  Procedure Laterality Date   APPENDECTOMY     ESOPHAGOGASTRODUODENOSCOPY N/A 10/02/2024   Procedure: EGD (ESOPHAGOGASTRODUODENOSCOPY);  Surgeon: Burnette Fallow, MD;  Location: [facility];  Service: Gastroenterology;  Laterality: N/A;   EUS N/A 10/02/2024   Procedure: ULTRASOUND, UPPER GI TRACT, ENDOSCOPIC;  Surgeon: Burnette Fallow, MD;  Location: [facility];  Service: Gastroenterology;  Laterality: N/A;   IR IMAGING GUIDED PORT INSERTION  10/09/2024   rotate cuff Right 2012    Allergies: Penicillins and Erythromycin  Medications: Prior to Admission medications  Medication Sig Start Date End Date Taking? Authorizing Provider  amLODipine  (NORVASC ) 5 MG tablet Take 1  tablet (5 mg total) by mouth daily. Patient not taking: Reported on 10/25/2024 02/02/24     amLODipine  (NORVASC ) 5 MG tablet Take 2 tablets (10 mg total) by mouth daily. 10/29/24     amphetamine -dextroamphetamine  (ADDERALL) 30 MG tablet 1 tablet Orally Twice a day (Fill date 10/15/2024) 10/15/24     dexamethasone  (DECADRON ) 4 MG tablet Take 2 tablets (8 mg total) by mouth daily. Start the day after chemotherapy for 2 days. Take with food. 10/03/24   Lanny Callander, MD  diphenoxylate -atropine  (LOMOTIL ) 2.5-0.025 MG tablet Take 1-2 tablets by mouth 4 (four) times daily as needed for diarrhea or loose stools. 11/01/24   Lanny Callander, MD  lidocaine -prilocaine  (EMLA ) cream Apply to affected area once 10/03/24   Lanny Callander, MD  loperamide  (IMODIUM ) 2 MG capsule Take 1 capsule (2 mg total) by mouth as needed for diarrhea or loose stools. 10/27/24   Zella, Mir M, MD  ondansetron  (ZOFRAN ) 8 MG tablet Take 1 tablet (8 mg total) by mouth every 8 (eight) hours as needed for nausea or vomiting. Start on the third day after chemotherapy. 10/03/24   Lanny Callander, MD  potassium chloride  SA (KLOR-CON  M) 20 MEQ tablet Take 1 tablet (20 mEq total) by mouth 2 (two) times daily. 11/01/24   Lanny Callander, MD  prochlorperazine  (COMPAZINE ) 10 MG tablet Take 1 tablet (10 mg total) by mouth every 6 (six) hours as needed for nausea or vomiting. 10/03/24   Lanny Callander, MD  [Paused] rosuvastatin  (CRESTOR ) 20 MG tablet Take 1 tablet (20 mg total) by mouth daily. Patient not taking: Reported on 10/25/2024 Wait to take this until your doctor or other care provider tells you to start again. 07/03/23   Joshua Debby CROME, MD  saccharomyces boulardii (FLORASTOR) 250 MG  capsule Take 1 capsule (250 mg total) by mouth 2 (two) times daily. 10/27/24   Zella, Mir M, MD  sucralfate  (CARAFATE ) 1 g tablet Take 1 tablet (1 g total) by mouth 2 (two) times daily on an empty stomach. 09/10/24     triamcinolone  cream (KENALOG ) 0.1 % Apply to affected area(s)  twice daily as needed 03/08/24        Family History  Problem Relation Age of Onset   Hypertension Father    Stroke Father    Alcoholism Father    Alcoholism Sister    Alcoholism Sister    Alcoholism Sister    Alcoholism Sister    Alcoholism Brother    Alcoholism Brother    Alcoholism Brother     Social History   Socioeconomic History   Marital status: Single    Spouse name: Not on file   Number of children: 2   Years of education: Not on file   Highest education level: Bachelor's degree (e.g., BA, AB, BS)  Occupational History   Not on file  Tobacco Use   Smoking status: Every Day    Current packs/day: 0.50    Average packs/day: 0.5 packs/day for 25.0 years (12.5 ttl pk-yrs)    Types: Cigarettes    Passive exposure: Past   Smokeless tobacco: Never  Vaping Use   Vaping status: Never Used  Substance and Sexual Activity   Alcohol use: Yes    Alcohol/week: 10.0 standard drinks of alcohol    Types: 10 Shots of liquor per week   Drug use: Not Currently   Sexual activity: Yes    Partners: Male    Birth control/protection: I.U.D., Condom  Other Topics Concern   Not on file  Social History Narrative   Not on file   Social Drivers of Health   Tobacco Use: High Risk (10/24/2024)   Patient History    Smoking Tobacco Use: Every Day    Smokeless Tobacco Use: Never    Passive Exposure: Past  Financial Resource Strain: Patient Declined (11/16/2023)   Overall Financial Resource Strain (CARDIA)    Difficulty of Paying Living Expenses: Patient declined  Food Insecurity: Food Insecurity Present (10/24/2024)   Epic    Worried About Programme Researcher, Broadcasting/film/video in the Last Year: Often true    Ran Out of Food in the Last Year: Patient declined  Transportation Needs: No Transportation Needs (10/24/2024)   Epic    Lack of Transportation (Medical): No    Lack of Transportation (Non-Medical): No  Recent Concern: Transportation Needs - Unmet Transportation Needs (09/21/2024)   Epic     Lack of Transportation (Medical): Yes    Lack of Transportation (Non-Medical): Yes  Physical Activity: Insufficiently Active (11/16/2023)   Exercise Vital Sign    Days of Exercise per Week: 1 day    Minutes of Exercise per Session: 30 min  Stress: Patient Declined (11/16/2023)   [employer] of Occupational Health - Occupational Stress Questionnaire    Feeling of Stress : Patient declined  Social Connections: Unknown (11/16/2023)   Social Connection and Isolation Panel    Frequency of Communication with Friends and Family: More than three times a week    Frequency of Social Gatherings with Friends and Family: Patient declined    Attends Religious Services: Patient declined    Database Administrator or Organizations: Yes    Attends Engineer, Structural: More than 4 times per year    Marital Status: Divorced  Depression (PHQ2-9): Low  Risk (11/01/2024)   Depression (PHQ2-9)    PHQ-2 Score: 0  Recent Concern: Depression (PHQ2-9) - High Risk (09/23/2024)   Depression (PHQ2-9)    PHQ-2 Score: 18  Alcohol Screen: Low Risk (11/16/2023)   Alcohol Screen    Last Alcohol Screening Score (AUDIT): 3  Housing: Low Risk (10/24/2024)   Epic    Unable to Pay for Housing in the Last Year: No    Number of Times Moved in the Last Year: 0    Homeless in the Last Year: No  Recent Concern: Housing - High Risk (09/21/2024)   Epic    Unable to Pay for Housing in the Last Year: Yes    Number of Times Moved in the Last Year: 0    Homeless in the Last Year: No  Utilities: Not At Risk (10/24/2024)   Epic    Threatened with loss of utilities: No  Health Literacy: Not on file       Review of Systems: denies fever,HA,CP,dyspnea, cough, abd/back pain,N/V or bleeding  Vital Signs: temp 98.7  BP 154/100  HR 82 R 16  O2 SATS 99% RA     Advance Care Plan: no documents on file  .  Physical Exam: awake/alert; chest- CTA bilat; heart- nl rate, occ ectopy noted; abd-soft,+BS,NT; no LE  edema  Imaging: CT ABDOMEN PELVIS WO CONTRAST Result Date: 10/24/2024 EXAM: CT Abdomen and Pelvis without Intravenous Contrast CLINICAL HISTORY: Abdominal pain, acute, nonlocalized; diarrhea; recent chemo. Pt endorses n/v/d starting yesterday. Also reports dizziness. Chemo tx last week. Pump removed Friday. * Tracking Code: BO * TECHNIQUE: Axial computed tomography images of the abdomen and pelvis without intravenous contrast. Dose reduction technique was used including one or more of the following: automated exposure control, adjustment of mA and kV according to patient size, and/or iterative reconstruction. CONTRAST: NONE COMPARISON: PET CT 10/11/2024, CT chest and pelvis 09/28/2024. FINDINGS: LUNG BASES: No basilar airspace consolidation or pleural effusion. LIVER: Unremarkable. GALLBLADDER AND BILE DUCTS: Unremarkable. No calcified stone. No ductal dilation. PANCREAS: Unremarkable. SPLEEN: Unremarkable. ADRENAL GLANDS: Unremarkable. KIDNEYS, URETERS, AND BLADDER: Unremarkable. No hydronephrosis or nephrolithiasis. No ureteral or bladder calculi. STOMACH AND BOWEL: The stomach, duodenum, and small bowel are normal. No evidence of small bowel obstruction or inflammation. Fluid stool throughout the colon. There is mild inflammation along the mesenteric border of the proximal sigmoid colon. There is an enlarged diverticulum in this region on imaging 48 of series 4 and 64 of series 2. No perforation or abscess. The above left colon inflammation is new from most recent CT comparison. APPENDIX: Post appendectomy. PERITONEUM: No free fluid. No free air. LYMPH NODES: No lymphadenopathy. REPRODUCTIVE: The uterus is lobulated with a calcified leiomyoma on the left border. IUD in expected location. VASCULATURE: No aortic aneurysm. ABDOMINAL WALL AND SOFT TISSUES: Unremarkable. BONES: No fracture or suspicious osseous abnormality. IMPRESSION: 1. Acute uncomplicated sigmoid diverticulitis, new compared to prior CT,  without perforation or abscess. 2. Lobulated uterus with calcified leiomyoma and IUD in expected position. Electronically signed by: Norleen Boxer MD 10/24/2024 02:17 PM EST RP Workstation: [id number]   NM PET Image Initial (PI) Skull Base To Thigh Result Date: 10/14/2024 CLINICAL DATA:  Initial treatment strategy for esophageal cancer. EXAM: NUCLEAR MEDICINE PET SKULL BASE TO THIGH TECHNIQUE: 8.9 mCi F-18 FDG was injected intravenously. Full-ring PET imaging was performed from the skull base to thigh after the radiotracer. CT data was obtained and used for attenuation correction and anatomic localization. Fasting blood glucose: 111 mg/dl COMPARISON:  CT chest abdomen pelvis 09/28/2024. FINDINGS: Mediastinal blood pool activity: SUV max 2.7 Liver activity: SUV max NA NECK: Misregistration artifact.  No abnormal hypermetabolism. Incidental CT findings: None. CHEST: Hypermetabolic distal esophageal mass measures roughly 2.9 x 3.2 cm (4/94), SUV max 10.9. Adjacent hypermetabolic distal periesophageal lymph nodes measure up to 1.4 cm (4/93). No additional abnormal hypermetabolism. Incidental CT findings: Right IJ Port-A-Cath terminates in the right atrium. Atherosclerotic calcification of the aorta with age advanced involvement of all 3 coronary arteries. Heart is at the upper limits of normal in size to mildly enlarged. No pericardial effusion. Trace left pleural fluid. Bilateral dependent atelectasis. Similar nodularity in the right breast. ABDOMEN/PELVIS: No abnormal hypermetabolism. Incidental CT findings: Small low-attenuation lesion in the right kidney. No specific follow-up necessary. Intrauterine contraceptive device. Enlarged fibroid uterus. Tiny pelvic free fluid. SKELETON: Nodule in the medial right gluteus musculature measures 1.6 x 2.6 cm (4/193), SUV max 7.6. No abnormal osseous hypermetabolism. Incidental CT findings: Degenerative changes in the spine. IMPRESSION: 1. Hypermetabolic distal esophageal  carcinoma with hypermetabolic distal periesophageal lymph nodes. 2. Hypermetabolic nodule in the medial right gluteus musculature, worrisome for malignancy. 3.  Age advanced three-vessel coronary artery calcification. 4. Trace left pleural effusion. 5. Nodularity in the right breast. Recommend correlation with physical exam, mammography and possible ultrasound, as clinically indicated. 6. Enlarged fibroid uterus. 7.  Aortic atherosclerosis (ICD10-I70.0). Electronically Signed   By: Newell Eke M.D.   On: 10/14/2024 09:58   IR IMAGING GUIDED PORT INSERTION Result Date: 10/09/2024 INDICATION: 53 year old female with history of esophageal cancer requiring central venous access for chemotherapy administration. EXAM: IMPLANTED PORT A CATH PLACEMENT WITH ULTRASOUND AND FLUOROSCOPIC GUIDANCE COMPARISON:  None Available. MEDICATIONS: None. ANESTHESIA/SEDATION: Moderate (conscious) sedation was employed during this procedure. A total of Versed  3 mg and Fentanyl  100 mcg was administered intravenously. Moderate Sedation Time: 17 minutes. The patient's level of consciousness and vital signs were monitored continuously by radiology nursing throughout the procedure under my direct supervision. CONTRAST:  None FLUOROSCOPY TIME:  0 mGy reference air kerma COMPLICATIONS: None immediate. PROCEDURE: The procedure, risks, benefits, and alternatives were explained to the patient. Questions regarding the procedure were encouraged and answered. The patient understands and consents to the procedure. The right neck and chest were prepped with chlorhexidine in a sterile fashion, and a sterile drape was applied covering the operative field. Maximum barrier sterile technique with sterile gowns and gloves were used for the procedure. A timeout was performed prior to the initiation of the procedure. Ultrasound was used to examine the jugular vein which was compressible and free of internal echoes. A skin marker was used to demarcate the  planned venotomy and port pocket incision sites. Local anesthesia was provided to these sites and the subcutaneous tunnel track with 1% lidocaine  with 1:100,000 epinephrine . A small incision was created at the jugular access site and blunt dissection was performed of the subcutaneous tissues. Under ultrasound guidance, the jugular vein was accessed with a 21 ga micropuncture needle and an 0.018 wire was inserted to the superior vena cava. Real-time ultrasound guidance was utilized for vascular access including the acquisition of a permanent ultrasound image documenting patency of the accessed vessel. A 5 Fr micopuncture set was then used, through which a 0.035 Rosen wire was passed under fluoroscopic guidance into the inferior vena cava. An 8 Fr dilator was then placed over the wire. A subcutaneous port pocket was then created along the upper chest wall utilizing a combination of sharp and blunt dissection.  The pocket was irrigated with sterile saline, packed with gauze, and observed for hemorrhage. A single lumen clear view power injectable port was chosen for placement. The 8 Fr catheter was tunneled from the port pocket site to the venotomy incision. The port was placed in the pocket. The external catheter was trimmed to appropriate length. The dilator was exchanged for an 8 Fr peel-away sheath under fluoroscopic guidance. The catheter was then placed through the sheath and the sheath was removed. Final catheter positioning was confirmed and documented with a fluoroscopic spot radiograph. The port was accessed with a Huber needle, aspirated, and flushed with heparinized saline. The deep dermal layer of the port pocket incision was closed with interrupted 3-0 Vicryl suture. The skin was opposed with a running subcuticular 4-0 Monocryl suture. Dermabond was then placed over the port pocket and neck incisions. The patient tolerated the procedure well without immediate post procedural complication. FINDINGS: After  catheter placement, the tip lies within the superior cavoatrial junction. The catheter aspirates and flushes normally and is ready for immediate use. IMPRESSION: Successful placement of a power injectable Port-A-Cath via the right internal jugular vein. The catheter is ready for immediate use. Ester Sides, MD [facility] Electronically Signed   By: Ester Sides M.D.   On: 10/09/2024 16:07    Labs:  CBC: Recent Labs    10/24/24 0935 10/25/24 0601 10/26/24 1052 11/01/24 0931  WBC 10.2 8.4 13.5* 9.9  HGB 14.2 12.6 13.4 13.9  HCT 41.1 37.8 39.2 39.8  PLT 218 215 255 202    COAGS: No results for input(s): INR, APTT in the last 8760 hours.  BMP: Recent Labs    10/24/24 0935 10/25/24 0601 10/26/24 1052 11/01/24 0931  NA 140 139 139 139  K 3.4* 3.2* 3.7 3.0*  CL 100 108 108 104  CO2 25 18* 21* 20*  GLUCOSE 102* 93 110* 99  BUN 29* 22* 11 16  CALCIUM  9.7 8.1* 8.4* 8.8*  CREATININE 1.82* 1.34* 1.24* 2.15*  GFRNONAA 32* 47* 51* 26*    LIVER FUNCTION TESTS: Recent Labs    10/24/24 0935 10/25/24 0601 10/26/24 1052 11/01/24 0931  BILITOT 0.4 <0.2 <0.2 0.4  AST 18 14* 16 23  ALT 12 7 8 16   ALKPHOS 112 107 232* 78  PROT 6.9 5.9* 6.1* 7.1  ALBUMIN 4.1 3.4* 3.5 4.1    TUMOR MARKERS: Recent Labs    09/30/24 1513 11/01/24 0931  CEA 7.89* 6.59*    Assessment and Plan: 53 y.o. female smoker with past medical history significant for ADD, hypertension, narcolepsy who presents now with recently diagnosed esophageal carcinoma and  PET scan revealing:  1. Hypermetabolic distal esophageal carcinoma with hypermetabolic distal periesophageal lymph nodes. 2. Hypermetabolic nodule in the medial right gluteus musculature, worrisome for malignancy. 3.  Age advanced three-vessel coronary artery calcification. 4. Trace left pleural effusion. 5. Nodularity in the right breast. Recommend correlation with physical exam,  mammography and possible ultrasound, as clinically indicated. 6. Enlarged fibroid uterus. 7.  Aortic atherosclerosis  She is scheduled today for image guided biopsy of the right gluteal musculature nodule.Risks and benefits of procedure was discussed with the patient   including, but not limited to bleeding, infection, damage to adjacent structures or low yield requiring additional tests or inability to perform biopsy.  All of the questions were answered and there is agreement to proceed.  Consent signed and in chart.  Pt known to IR team from Port-A-Cath placement on  10/09/2024  Thank you for allowing our service to participate in Toni Dougherty 's care.  Electronically Signed: D. Franky Rakers, PA-C   11/05/2024, 5:07 PM      I spent a total of 20 minutes   in face to face in clinical consultation, greater than 50% of which was counseling/coordinating care for image guided biopsy of right gluteal nodule   "

## 2024-11-06 ENCOUNTER — Inpatient Hospital Stay

## 2024-11-06 ENCOUNTER — Other Ambulatory Visit: Payer: Self-pay

## 2024-11-06 ENCOUNTER — Encounter (HOSPITAL_COMMUNITY): Payer: Self-pay

## 2024-11-06 ENCOUNTER — Ambulatory Visit (HOSPITAL_COMMUNITY)
Admission: RE | Admit: 2024-11-06 | Discharge: 2024-11-06 | Disposition: A | Source: Ambulatory Visit | Attending: Hematology

## 2024-11-06 ENCOUNTER — Ambulatory Visit (HOSPITAL_COMMUNITY)
Admission: RE | Admit: 2024-11-06 | Discharge: 2024-11-06 | Disposition: A | Discharge status: Home / Self Care | Source: Ambulatory Visit | Attending: Hematology | Admitting: Hematology

## 2024-11-06 VITALS — BP 120/95 | HR 86 | Temp 98.3°F | Resp 18 | Ht 72.0 in | Wt 177.0 lb

## 2024-11-06 DIAGNOSIS — R222 Localized swelling, mass and lump, trunk: Secondary | ICD-10-CM

## 2024-11-06 DIAGNOSIS — I2510 Atherosclerotic heart disease of native coronary artery without angina pectoris: Secondary | ICD-10-CM | POA: Insufficient documentation

## 2024-11-06 DIAGNOSIS — Z01818 Encounter for other preprocedural examination: Secondary | ICD-10-CM

## 2024-11-06 DIAGNOSIS — C155 Malignant neoplasm of lower third of esophagus: Secondary | ICD-10-CM | POA: Insufficient documentation

## 2024-11-06 DIAGNOSIS — I10 Essential (primary) hypertension: Secondary | ICD-10-CM | POA: Insufficient documentation

## 2024-11-06 DIAGNOSIS — I700 Atherosclerosis of aorta: Secondary | ICD-10-CM | POA: Insufficient documentation

## 2024-11-06 DIAGNOSIS — D259 Leiomyoma of uterus, unspecified: Secondary | ICD-10-CM | POA: Insufficient documentation

## 2024-11-06 DIAGNOSIS — N6310 Unspecified lump in the right breast, unspecified quadrant: Secondary | ICD-10-CM | POA: Insufficient documentation

## 2024-11-06 DIAGNOSIS — C7989 Secondary malignant neoplasm of other specified sites: Secondary | ICD-10-CM | POA: Insufficient documentation

## 2024-11-06 LAB — COMPREHENSIVE METABOLIC PANEL WITH GFR
ALT: 15 U/L (ref 0–44)
AST: 21 U/L (ref 15–41)
Albumin: 3.9 g/dL (ref 3.5–5.0)
Alkaline Phosphatase: 75 U/L (ref 38–126)
Anion gap: 11 (ref 5–15)
BUN: 8 mg/dL (ref 6–20)
CO2: 23 mmol/L (ref 22–32)
Calcium: 8.7 mg/dL — ABNORMAL LOW (ref 8.9–10.3)
Chloride: 106 mmol/L (ref 98–111)
Creatinine, Ser: 1.03 mg/dL — ABNORMAL HIGH (ref 0.44–1.00)
GFR, Estimated: >60 mL/min
Glucose, Bld: 119 mg/dL — ABNORMAL HIGH (ref 70–99)
Potassium: 3.3 mmol/L — ABNORMAL LOW (ref 3.5–5.1)
Sodium: 140 mmol/L (ref 135–145)
Total Bilirubin: 0.4 mg/dL (ref 0.0–1.2)
Total Protein: 6.9 g/dL (ref 6.5–8.1)

## 2024-11-06 LAB — HCG, SERUM, QUALITATIVE: Preg, Serum: NEGATIVE

## 2024-11-06 LAB — CBC WITH DIFFERENTIAL/PLATELET
Abs Immature Granulocytes: 0.05 K/uL (ref 0.00–0.07)
Basophils Absolute: 0.1 K/uL (ref 0.0–0.1)
Basophils Relative: 1 %
Eosinophils Absolute: 0.1 K/uL (ref 0.0–0.5)
Eosinophils Relative: 1 %
HCT: 37.9 % (ref 36.0–46.0)
Hemoglobin: 13.5 g/dL (ref 12.0–15.0)
Immature Granulocytes: 1 %
Lymphocytes Relative: 33 %
Lymphs Abs: 2.6 K/uL (ref 0.7–4.0)
MCH: 31.4 pg (ref 26.0–34.0)
MCHC: 35.6 g/dL (ref 30.0–36.0)
MCV: 88.1 fL (ref 80.0–100.0)
Monocytes Absolute: 0.6 K/uL (ref 0.1–1.0)
Monocytes Relative: 7 %
Neutro Abs: 4.6 K/uL (ref 1.7–7.7)
Neutrophils Relative %: 57 %
Platelets: 265 K/uL (ref 150–400)
RBC: 4.3 MIL/uL (ref 3.87–5.11)
RDW: 15.1 % (ref 11.5–15.5)
WBC: 8.1 K/uL (ref 4.0–10.5)
nRBC: 0 % (ref 0.0–0.2)

## 2024-11-06 LAB — PROTIME-INR
INR: 1 (ref 0.8–1.2)
Prothrombin Time: 13.9 s (ref 11.4–15.2)

## 2024-11-06 MED ORDER — FENTANYL CITRATE (PF) 100 MCG/2ML IJ SOLN
INTRAMUSCULAR | Status: AC
  Filled 2024-11-06: qty 2

## 2024-11-06 MED ORDER — MIDAZOLAM HCL (PF) 2 MG/2ML IJ SOLN
INTRAMUSCULAR | Status: AC | PRN
  Administered 2024-11-06 (×2): 1 mg via INTRAVENOUS

## 2024-11-06 MED ORDER — MIDAZOLAM HCL 2 MG/2ML IJ SOLN
INTRAMUSCULAR | Status: AC
  Filled 2024-11-06: qty 2

## 2024-11-06 MED ORDER — HEPARIN SOD (PORK) LOCK FLUSH 100 UNIT/ML IV SOLN
500.0000 [IU] | Freq: Once | INTRAVENOUS | Status: AC
  Administered 2024-11-06: 500 [IU] via INTRAVENOUS
  Filled 2024-11-06: qty 5

## 2024-11-06 MED ORDER — LIDOCAINE HCL 1 % IJ SOLN
INTRAMUSCULAR | Status: AC
  Filled 2024-11-06: qty 20

## 2024-11-06 MED ORDER — HEPARIN SOD (PORK) LOCK FLUSH 100 UNIT/ML IV SOLN
500.0000 [IU] | INTRAVENOUS | Status: DC | PRN
  Filled 2024-11-06: qty 5

## 2024-11-06 MED ORDER — FENTANYL CITRATE (PF) 100 MCG/2ML IJ SOLN
INTRAMUSCULAR | Status: AC | PRN
  Administered 2024-11-06 (×2): 50 ug via INTRAVENOUS

## 2024-11-06 MED ORDER — SODIUM CHLORIDE 0.9 % IV SOLN: INTRAVENOUS | Status: DC

## 2024-11-06 MED ORDER — HEPARIN SOD (PORK) LOCK FLUSH 100 UNIT/ML IV SOLN
INTRAVENOUS | Status: AC
  Filled 2024-11-06: qty 5

## 2024-11-06 NOTE — Discharge Instructions (Signed)
Discharge Instructions:   Please call Interventional Radiology clinic [phone] with any questions or concerns.  You may remove your dressing and shower tomorrow.    Moderate Conscious Sedation, Adult, Care After This sheet gives you information about how to care for yourself after your procedure. Your health care provider may also give you more specific instructions. If you have problems or questions, contact your health care provider. What can I expect after the procedure? After the procedure, it is common to have: Sleepiness for several hours. Impaired judgment for several hours. Difficulty with balance. Vomiting if you eat too soon. Follow these instructions at home: For the time period you were told by your health care provider:   Rest. Do not participate in activities where you could fall or become injured. Do not drive or use machinery. Do not drink alcohol. Do not take sleeping pills or medicines that cause drowsiness. Do not make important decisions or sign legal documents. Do not take care of children on your own. Eating and drinking  Follow the diet recommended by your health care provider. Drink enough fluid to keep your urine pale yellow. If you vomit: Drink water, juice, or soup when you can drink without vomiting. Make sure you have little or no nausea before eating solid foods. General instructions Take over-the-counter and prescription medicines only as told by your health care provider. Have a responsible adult stay with you for the time you are told. It is important to have someone help care for you until you are awake and alert. Do not smoke. Keep all follow-up visits as told by your health care provider. This is important. Contact a health care provider if: You are still sleepy or having trouble with balance after 24 hours. You feel light-headed. You keep feeling nauseous or you keep vomiting. You develop a rash. You have a fever. You have redness  or swelling around the IV site. Get help right away if: You have trouble breathing. You have new-onset confusion at home. Summary After the procedure, it is common to feel sleepy, have impaired judgment, or feel nauseous if you eat too soon. Rest after you get home. Know the things you should not do after the procedure. Follow the diet recommended by your health care provider and drink enough fluid to keep your urine pale yellow. Get help right away if you have trouble breathing or new-onset confusion at home. This information is not intended to replace advice given to you by your health care provider. Make sure you discuss any questions you have with your health care provider. Document Revised: 02/28/2020 Document Reviewed: 09/26/2019 Elsevier Patient Education  2023 Elsevier Inc.    Needle Biopsy, Care After The following information offers guidance on how to care for yourself after your procedure. Your health care provider may also give you more specific instructions. If you have problems or questions, contact your health care provider. What can I expect after the procedure? After the procedure, it is common to have: Soreness, pain, and tenderness where a tissue sample was taken (biopsy site). Bruising or mild pain at the biopsy site. These symptoms should go away after a few days. Follow these instructions at home: Biopsy site care  Follow instructions from your health care provider about how to take care of your biopsy site. Make sure you: Wash your hands with soap and water for at least 20 seconds before and after you change your bandage (dressing). If soap and water are not available, use hand sanitizer. Know   when and how to change your dressing. Know when to remove your dressing. Check your puncture site every day for signs of infection. Check for: More redness, swelling, or pain. More drainage of fluid or blood. More warmth. Pus or a bad smell. General instructions Rest as  told by your health care provider. Do not take baths, swim, or use a hot tub until your health care provider approves. Ask your health care provider if you may take showers. You may only be allowed to take sponge baths. Take over-the-counter and prescription medicines only as told by your health care provider. Return to your normal activities as told by your health care provider. Ask your health care provider what activities are safe for you. If you have airplane travel scheduled, talk with your health care provider about when it is safe for you to travel by airplane. This is specific to certain biopsy procedures. It is up to you to get the results of your procedure. Ask your health care provider, or the department that is doing the procedure, when your results will be ready. Keep all follow-up visits. You may need to make an appointment to get your biopsy results. Contact a health care provider if: You have a fever. You have more redness, swelling, or pain at the puncture site that lasts longer than a few days. You have more fluid or blood coming from your puncture site. You have pus or a bad smell coming from your puncture site. Your puncture site feels warm to the touch. You have pain that does not get better with medicine. Get help right away if: You have severe bleeding from the puncture site. You have chest pain. You have problems breathing. You cough up blood. You faint. You have a very fast heart rate. These symptoms may be an emergency. Get help right away. Call 911. Do not wait to see if the symptoms will go away. Do not drive yourself to the hospital. Summary After the procedure, it is common to have soreness, bruising, tenderness, or mild pain at the biopsy site. These symptoms should go away in a few days. Check your biopsy site every day for signs of infection, such as more redness, swelling, or pain. Do not take baths, swim, or use a hot tub until your health care provider  approves. Ask your health care provider if you may take showers. Contact a heath care provider if you have more redness, swelling, or pain at the puncture site that lasts longer than a few days. This information is not intended to replace advice given to you by your health care provider. Make sure you discuss any questions you have with your health care provider. Document Revised: 10/27/2021 Document Reviewed: 10/27/2021 Elsevier Patient Education  2023 Elsevier Inc.  

## 2024-11-06 NOTE — Procedures (Signed)
 Interventional Radiology Procedure Note  Procedure: US  CORE BX RT GLUTEAL SOFT TISSUE MASS    Complications: None  Estimated Blood Loss:  MIN  Findings: 77 G CORES IN FORMALIN    M. TREVOR Kynslee Baham, MD

## 2024-11-08 LAB — SURGICAL PATHOLOGY

## 2024-11-12 ENCOUNTER — Other Ambulatory Visit: Payer: Self-pay | Admitting: Nurse Practitioner

## 2024-11-12 ENCOUNTER — Encounter: Payer: Self-pay | Admitting: Nurse Practitioner

## 2024-11-12 DIAGNOSIS — C155 Malignant neoplasm of lower third of esophagus: Secondary | ICD-10-CM

## 2024-11-12 NOTE — Progress Notes (Unsigned)
 " Patient Care Team: Sheldon Netter, [name] as PCP - General (Physician Assistant)  Clinic Day:  11/13/2024  Referring physician: Lanny Callander, MD  ASSESSMENT & PLAN:   Assessment & Plan: Esophageal cancer (HCC) cT3N2M0, MMR normal, Her2 (-), PD-L1 CPS 100% - Patient presented with progressive dysphagia and a 50 pound weight loss -EGD showed a large ulcerated, partially obstructed mass in the distal esophagus, extending into GE junction and gastric cardia, biopsy showed poorly differentiated adenocarcinoma. -PET scan was denied by insurance, CT showed enlarged mediastinal adenopathy, US  staging T3 N2 -Neoadjuvant chemotherapy FLOT and durvalumab  was recommended.She started on 10/17/2024.  -Significant negative side effects from initial dose, likely related to docetaxel .  This has been dose reduced to 35 mg/m.  Day 15 cycle 1 chemotherapy FLOT deferred and currently scheduled for 11/15/2024. - Patient underwent needle biopsy of right gluteal mass detected on PET scan.  Biopsy confirmed metastatic adenocarcinoma. - Consult with Dr. Cloretta, Dr. Lonn, and Dr. Shyrl prior to administration of Cycle 1 day 15 chemotherapy FLOT.  The patient understands that she is currently not a surgical candidate for her esophageal cancer due to metastatic disease confirmed on gluteal mass biopsy.  She also understands that chemotherapy will likely change starting with next cycle.  Understanding negative side effects from cycle 1 day 1 chemotherapy FLOT with durvalumab , and probability of chemotherapy change, patient does not wish to hold chemotherapy FLOT, currently scheduled for 11/15/2024.  Will proceed as scheduled and recommendations will be discussed with Dr.Fen prior to patient's next visit scheduled for 11/29/2024.g   Chemotherapy related acute kidney injury and hypokalemia Significant improvement in renal functions today.  BUN 15, serum creatinine 1.22, eGFR is 52, up from 22 on most recent check.  Potassium  level is normal at 4.6.  Magnesium is normal at 2.1.  Diarrhea has significantly improved.  She understands that AKI likely related to diarrhea from treatment with docetaxel .  Chemotherapy held 11/01/2024 due to significant diarrhea and dehydration resulting in AKI.  Cycle 1 day 15 chemotherapy with FLOT plan for 11/15/2024.  Dose Taxol dose reduced to 35 mg/m.  Esophageal cancer with metastases Biopsy of gluteal mass confirmed metastatic adenocarcinoma, consistent with upper GI primary.  Consult with Dr.  Lonn, Dr. Cloretta, and Dr.  Shyrl.  Patient no longer a surgical candidate due to metastatic disease.  Plan will likely consist of change of chemotherapy.  Recommendation from Dr. Cloretta is FOLFOX with nivolumab.  Discussed option to hold chemotherapy FLOT with patient and friend, on the phone during today's OV.  Patient does not wish to hold treatment at all.  Wishes to proceed with chemotherapy as scheduled with dose reduced dose Taxol.  She understands she may still have considerable side effects from docetaxel , even with reduction.  She does understand that she should be proactive with management of side effects.  She will call the cancer center for any side effects uncontrolled with previously prescribed medications for diarrhea and nausea.  Plan to proceed with chemotherapy FLOT as scheduled on 11/15/2024.  Modifications to be discussed with Dr.Feng prior to patient's next treatment, currently scheduled for 11/29/2024.  Plan Labs reviewed. -CBC is unremarkable. - Normal magnesium of 2.1. - CMP with marked improvement of renal functions and normal potassium of 4.6.  Labs Reviewed biopsy of gluteal mass seen on PET scan. -Confirmed metastatic adenocarcinoma, consistent with upper GI primary. -- She understands she is no longer surgical candidate per discussion with Dr. Shyrl.  Recommendation from Dr. Cloretta is to  change chemotherapy to FOLFOX with nivolumab, starting with next cycle.   Modifications to chemotherapy to be made with Dr.Feng prior to next treatment currently scheduled for 11/29/2024.  Patient wishes to proceed with cycle 1 day 15 chemotherapy FLOT as scheduled on 11/15/2024.  This is with dose reduction of docetaxel  to 35 mg/m.  She understands that treatment will likely change with next cycle and she is currently not a surgical candidate. Proceed with chemotherapy FLOT as scheduled 11/15/2024.  Labs/flush and follow-up as scheduled with Dr.Fe as scheduled on 11/29/2024.   The patient understands the plans discussed today and is in agreement with them.  She knows to contact our office if she develops concerns prior to her next appointment.  I provided 30 minutes of face-to-face time during this encounter and > 50% was spent counseling as documented under my assessment and plan.    [name], NP  [facility] - A DEPT OF [facility] [address] [place] [address] Dept: [phone] Dept Fax: [phone]   No orders of the defined types were placed in this encounter.     CHIEF COMPLAINT:  CC: Malignant neoplasm of lower third of the esophagus  Current Treatment: Neoadjuvant chemotherapy FLOT and durvalumab   INTERVAL HISTORY:  Toni Dougherty is here today for repeat clinical assessment.  The patient last saw Dr. Wendell on 11/01/2024.  Since then, she did have biopsy of a muscle mass detected on PET scan located in the right gluteus.  Biopsy confirmed metastatic adenocarcinoma.  She is currently scheduled to have cycle 1 day 15 chemotherapy FLOT on 11/15/2024.  She wishes to proceed with chemotherapy as scheduled.  Docetaxel  is dose reduced to 35 mg per squared.  Discussed all recommendations made per consultations with Dr. Shyrl Joline Bedford, and Dr. Cloretta.  She understands that she is no longer a good surgical candidate due to metastatic adenocarcinoma.  She also understands that chemotherapy  will likely change starting with next cycle.  Plans to discuss further with Dr.Feng at next visit, currently scheduled for 11/29/2024.  Physically, the patient reports feeling much better.  Improved energy.  She denies chest pain, chest pressure, or shortness of breath. She denies headaches or visual disturbances. She denies abdominal pain, nausea, vomiting, or changes in bowel or bladder habits.  She denies fevers or chills. She denies pain. Her appetite is good. Her weight has increased 7 pounds over last 2 weeks.  I have reviewed the past medical history, past surgical history, social history and family history with the patient and they are unchanged from previous note.  ALLERGIES:  is allergic to penicillins and erythromycin.  MEDICATIONS:  Current Outpatient Medications  Medication Sig Dispense Refill   amLODipine  (NORVASC ) 5 MG tablet Take 1 tablet (5 mg total) by mouth daily. (Patient not taking: Reported on 10/25/2024) 90 tablet 1   amLODipine  (NORVASC ) 5 MG tablet Take 2 tablets (10 mg total) by mouth daily. 180 tablet 0   amphetamine -dextroamphetamine  (ADDERALL) 30 MG tablet 1 tablet Orally Twice a day (Fill date 10/15/2024) 60 tablet 0   dexamethasone  (DECADRON ) 4 MG tablet Take 2 tablets (8 mg total) by mouth daily. Start the day after chemotherapy for 2 days. Take with food. 30 tablet 1   diphenoxylate -atropine  (LOMOTIL ) 2.5-0.025 MG tablet Take 1-2 tablets by mouth 4 (four) times daily as needed for diarrhea or loose stools. 60 tablet 1   lidocaine -prilocaine  (EMLA ) cream Apply to affected area  once 30 g 3   loperamide  (IMODIUM ) 2 MG capsule Take 1 capsule (2 mg total) by mouth as needed for diarrhea or loose stools.     ondansetron  (ZOFRAN ) 8 MG tablet Take 1 tablet (8 mg total) by mouth every 8 (eight) hours as needed for nausea or vomiting. Start on the third day after chemotherapy. 30 tablet 1   potassium chloride  SA (KLOR-CON  M) 20 MEQ tablet Take 1 tablet (20 mEq total) by mouth 2  (two) times daily. 30 tablet 1   prochlorperazine  (COMPAZINE ) 10 MG tablet Take 1 tablet (10 mg total) by mouth every 6 (six) hours as needed for nausea or vomiting. 30 tablet 1   [Paused] rosuvastatin  (CRESTOR ) 20 MG tablet Take 1 tablet (20 mg total) by mouth daily. (Patient not taking: Reported on 10/25/2024) 90 tablet 1   saccharomyces boulardii (FLORASTOR) 250 MG capsule Take 1 capsule (250 mg total) by mouth 2 (two) times daily. 60 capsule 0   sucralfate  (CARAFATE ) 1 g tablet Take 1 tablet (1 g total) by mouth 2 (two) times daily on an empty stomach. 60 tablet 1   triamcinolone  cream (KENALOG ) 0.1 % Apply to affected area(s) twice daily as needed 30 g 1   No current facility-administered medications for this visit.    HISTORY OF PRESENT ILLNESS:   Oncology History  Esophageal cancer (HCC)  09/18/2024 Cancer Staging   Staging form: Esophagus - Adenocarcinoma, AJCC 8th Edition - Clinical stage from 09/18/2024: Stage IVA (cT3, cN2, cM0, G3) - Signed by Lanny Callander, MD on 10/03/2024 Histologic grading system: 3 grade system   10/03/2024 Initial Diagnosis   Esophageal cancer (HCC)   10/17/2024 -  Chemotherapy   Patient is on Treatment Plan : GASTROESOPHAGEAL Durvalumab  q28d + FLOT q14d / Durvalumab  q28d         REVIEW OF SYSTEMS:   Constitutional: Denies fevers, chills or abnormal weight loss. Improved appetite and fatigue.  Eyes: Denies blurriness of vision  Ears, nose, mouth, throat, and face: Denies mucositis or sore throat Respiratory: Denies cough, dyspnea or wheezes Cardiovascular: Denies palpitation, chest discomfort or lower extremity swelling Gastrointestinal:  Denies nausea, heartburn or change in bowel habits Skin: Denies abnormal skin rashes Lymphatics: Denies new lymphadenopathy or easy bruising Neurological:Denies numbness, tingling or new weaknesses Behavioral/Psych: Mood is stable, no new changes  All other systems were reviewed with the patient and are  negative.   VITALS:   Today's Vitals   11/13/24 1000  BP: 134/73  Pulse: 70  Resp: 16  Temp: 98 F (36.7 C)  TempSrc: Temporal  SpO2: 100%  Weight: 184 lb 1.6 oz (83.5 kg)  Height: 6' (1.829 m)  PainSc: 0-No pain   Body mass index is 24.97 kg/m.   Wt Readings from Last 3 Encounters:  11/13/24 184 lb 1.6 oz (83.5 kg)  11/06/24 177 lb (80.3 kg)  11/01/24 177 lb 11.2 oz (80.6 kg)    Body mass index is 24.97 kg/m.  Performance status (ECOG): 2 - Symptomatic, <50% confined to bed  PHYSICAL EXAM:   GENERAL:alert, no distress and comfortable SKIN: skin color, texture, turgor are normal, no rashes or significant lesions EYES: normal, Conjunctiva are pink and non-injected, sclera clear OROPHARYNX:no exudate, no erythema and lips, buccal mucosa, and tongue normal  NECK: supple, thyroid normal size, non-tender, without nodularity LYMPH:  no palpable lymphadenopathy in the cervical, axillary or inguinal LUNGS: clear to auscultation and percussion with normal breathing effort HEART: regular rate & rhythm and no murmurs and  no lower extremity edema ABDOMEN:abdomen soft, non-tender and normal bowel sounds Musculoskeletal:no cyanosis of digits and no clubbing  NEURO: alert & oriented x 3 with fluent speech, no focal motor/sensory deficits  LABORATORY DATA:  I have reviewed the data as listed    Component Value Date/Time   NA 138 11/13/2024 0929   K 4.6 11/13/2024 0929   CL 105 11/13/2024 0929   CO2 23 11/13/2024 0929   GLUCOSE 109 (H) 11/13/2024 0929   BUN 16 11/13/2024 0929   CREATININE 1.22 (H) 11/13/2024 0929   CALCIUM  9.3 11/13/2024 0929   PROT 7.2 11/13/2024 0929   ALBUMIN 4.0 11/13/2024 0929   AST 21 11/13/2024 0929   ALT 13 11/13/2024 0929   ALKPHOS 66 11/13/2024 0929   BILITOT 0.4 11/13/2024 0929   GFRNONAA 52 (L) 11/13/2024 0929     Lab Results  Component Value Date   WBC 8.1 11/13/2024   NEUTROABS 4.9 11/13/2024   HGB 12.9 11/13/2024   HCT 37.9  11/13/2024   MCV 90.9 11/13/2024   PLT 302 11/13/2024    RADIOGRAPHIC STUDIES: US  CORE BIOPSY (SOFT TISSUE) Result Date: 11/06/2024 INDICATION: Esophageal cancer, PET positive right gluteal mass EXAM: ULTRASOUND CORE BIOPSY OF RIGHT GLUTEAL SOFT TISSUE MASS MEDICATIONS: 1% LIDOCAINE  LOCAL ANESTHESIA/SEDATION: Moderate (conscious) sedation was employed during this procedure. A total of Versed  2.0 mg and Fentanyl  100 mcg was administered intravenously by the radiology nurse. Total intra-service moderate Sedation Time: 10 minutes. The patient's level of consciousness and vital signs were monitored continuously by radiology nursing throughout the procedure under my direct supervision. COMPLICATIONS: None immediate. PROCEDURE: Informed written consent was obtained from the patient after a thorough discussion of the procedural risks, benefits and alternatives. All questions were addressed. Maximal Sterile Barrier Technique was utilized including caps, mask, sterile gowns, sterile gloves, sterile drape, hand hygiene and skin antiseptic. A timeout was performed prior to the initiation of the procedure. Previous imaging reviewed. Patient positioned prone. Preliminary ultrasound performed. The right gluteal intramuscular hypoechoic solid mass was localized and marked for biopsy. Under sterile conditions and local anesthesia, 18 gauge core biopsy needle was advanced to the nodule. 18 gauge core biopsies obtained. Samples were intact and non fragmented. These were placed in formalin. Postprocedure imaging demonstrates no hemorrhage or hematoma. Patient tolerated the biopsy well. IMPRESSION: Successful ultrasound right gluteal soft tissue mass core biopsy Electronically Signed   By: CHRISTELLA.  Shick M.D.   On: 11/06/2024 13:34   CT ABDOMEN PELVIS WO CONTRAST Result Date: 10/24/2024 EXAM: CT Abdomen and Pelvis without Intravenous Contrast CLINICAL HISTORY: Abdominal pain, acute, nonlocalized; diarrhea; recent chemo. Pt  endorses n/v/d starting yesterday. Also reports dizziness. Chemo tx last week. Pump removed Friday. * Tracking Code: BO * TECHNIQUE: Axial computed tomography images of the abdomen and pelvis without intravenous contrast. Dose reduction technique was used including one or more of the following: automated exposure control, adjustment of mA and kV according to patient size, and/or iterative reconstruction. CONTRAST: NONE COMPARISON: PET CT 10/11/2024, CT chest and pelvis 09/28/2024. FINDINGS: LUNG BASES: No basilar airspace consolidation or pleural effusion. LIVER: Unremarkable. GALLBLADDER AND BILE DUCTS: Unremarkable. No calcified stone. No ductal dilation. PANCREAS: Unremarkable. SPLEEN: Unremarkable. ADRENAL GLANDS: Unremarkable. KIDNEYS, URETERS, AND BLADDER: Unremarkable. No hydronephrosis or nephrolithiasis. No ureteral or bladder calculi. STOMACH AND BOWEL: The stomach, duodenum, and small bowel are normal. No evidence of small bowel obstruction or inflammation. Fluid stool throughout the colon. There is mild inflammation along the mesenteric border of the proximal sigmoid colon.  There is an enlarged diverticulum in this region on imaging 48 of series 4 and 64 of series 2. No perforation or abscess. The above left colon inflammation is new from most recent CT comparison. APPENDIX: Post appendectomy. PERITONEUM: No free fluid. No free air. LYMPH NODES: No lymphadenopathy. REPRODUCTIVE: The uterus is lobulated with a calcified leiomyoma on the left border. IUD in expected location. VASCULATURE: No aortic aneurysm. ABDOMINAL WALL AND SOFT TISSUES: Unremarkable. BONES: No fracture or suspicious osseous abnormality. IMPRESSION: 1. Acute uncomplicated sigmoid diverticulitis, new compared to prior CT, without perforation or abscess. 2. Lobulated uterus with calcified leiomyoma and IUD in expected position. Electronically signed by: Norleen Boxer MD 10/24/2024 02:17 PM EST RP Workstation: [id number]   "

## 2024-11-12 NOTE — Assessment & Plan Note (Signed)
 cT3N2M0, MMR normal, Her2 (-), PD-L1 CPS 100% - Patient presented with progressive dysphagia and a 50 pound weight loss -EGD showed a large ulcerated, partially obstructed mass in the distal esophagus, extending into GE junction and gastric cardia, biopsy showed poorly differentiated adenocarcinoma. -PET scan was denied by insurance, CT showed enlarged mediastinal adenopathy, US  staging T3 N2 -Neoadjuvant chemotherapy FLOT and durvalumab  was recommended.She started on 10/17/2024.  -Significant negative side effects from initial dose, likely related to docetaxel .  This has been dose reduced to 35 mg/m.  Day 15 cycle 1 chemotherapy FLOT deferred and currently scheduled for 11/15/2024. - Patient underwent needle biopsy of right gluteal mass detected on PET scan.  Biopsy confirmed metastatic adenocarcinoma. - Consult with Dr. Cloretta, Dr. Lonn, and Dr. Shyrl prior to administration of Cycle 1 day 15 chemotherapy FLOT.  The patient understands that she is currently not a surgical candidate for her esophageal cancer due to metastatic disease confirmed on gluteal mass biopsy.  She also understands that chemotherapy will likely change starting with next cycle.  Understanding negative side effects from cycle 1 day 1 chemotherapy FLOT with durvalumab , and probability of chemotherapy change, patient does not wish to hold chemotherapy FLOT, currently scheduled for 11/15/2024.  Will proceed as scheduled and recommendations will be discussed with Dr.Fen prior to patient's next visit scheduled for 11/29/2024.g

## 2024-11-13 ENCOUNTER — Inpatient Hospital Stay

## 2024-11-13 ENCOUNTER — Inpatient Hospital Stay (HOSPITAL_BASED_OUTPATIENT_CLINIC_OR_DEPARTMENT_OTHER): Admitting: Nurse Practitioner

## 2024-11-13 VITALS — BP 134/73 | HR 70 | Temp 98.0°F | Resp 16 | Ht 72.0 in | Wt 184.1 lb

## 2024-11-13 DIAGNOSIS — C155 Malignant neoplasm of lower third of esophagus: Secondary | ICD-10-CM | POA: Diagnosis not present

## 2024-11-13 LAB — CMP (CANCER CENTER ONLY)
ALT: 13 U/L (ref 0–44)
AST: 21 U/L (ref 15–41)
Albumin: 4 g/dL (ref 3.5–5.0)
Alkaline Phosphatase: 66 U/L (ref 38–126)
Anion gap: 10 (ref 5–15)
BUN: 16 mg/dL (ref 6–20)
CO2: 23 mmol/L (ref 22–32)
Calcium: 9.3 mg/dL (ref 8.9–10.3)
Chloride: 105 mmol/L (ref 98–111)
Creatinine: 1.22 mg/dL — ABNORMAL HIGH (ref 0.44–1.00)
GFR, Estimated: 52 mL/min — ABNORMAL LOW
Glucose, Bld: 109 mg/dL — ABNORMAL HIGH (ref 70–99)
Potassium: 4.6 mmol/L (ref 3.5–5.1)
Sodium: 138 mmol/L (ref 135–145)
Total Bilirubin: 0.4 mg/dL (ref 0.0–1.2)
Total Protein: 7.2 g/dL (ref 6.5–8.1)

## 2024-11-13 LAB — CBC WITH DIFFERENTIAL (CANCER CENTER ONLY)
Abs Immature Granulocytes: 0.02 K/uL (ref 0.00–0.07)
Basophils Absolute: 0.1 K/uL (ref 0.0–0.1)
Basophils Relative: 1 %
Eosinophils Absolute: 0.2 K/uL (ref 0.0–0.5)
Eosinophils Relative: 2 %
HCT: 37.9 % (ref 36.0–46.0)
Hemoglobin: 12.9 g/dL (ref 12.0–15.0)
Immature Granulocytes: 0 %
Lymphocytes Relative: 31 %
Lymphs Abs: 2.5 K/uL (ref 0.7–4.0)
MCH: 30.9 pg (ref 26.0–34.0)
MCHC: 34 g/dL (ref 30.0–36.0)
MCV: 90.9 fL (ref 80.0–100.0)
Monocytes Absolute: 0.5 K/uL (ref 0.1–1.0)
Monocytes Relative: 6 %
Neutro Abs: 4.9 K/uL (ref 1.7–7.7)
Neutrophils Relative %: 60 %
Platelet Count: 302 K/uL (ref 150–400)
RBC: 4.17 MIL/uL (ref 3.87–5.11)
RDW: 15.5 % (ref 11.5–15.5)
WBC Count: 8.1 K/uL (ref 4.0–10.5)
nRBC: 0 % (ref 0.0–0.2)

## 2024-11-13 LAB — PREGNANCY, URINE: Preg Test, Ur: NEGATIVE

## 2024-11-13 LAB — MAGNESIUM: Magnesium: 2.1 mg/dL (ref 1.7–2.4)

## 2024-11-14 ENCOUNTER — Encounter: Payer: Self-pay | Admitting: Nurse Practitioner

## 2024-11-14 ENCOUNTER — Encounter: Payer: Self-pay | Admitting: Hematology

## 2024-11-15 ENCOUNTER — Inpatient Hospital Stay: Attending: Hematology

## 2024-11-15 ENCOUNTER — Inpatient Hospital Stay

## 2024-11-15 ENCOUNTER — Inpatient Hospital Stay: Admitting: Nurse Practitioner

## 2024-11-15 ENCOUNTER — Inpatient Hospital Stay: Admitting: Licensed Clinical Social Worker

## 2024-11-15 ENCOUNTER — Inpatient Hospital Stay: Admitting: Nutrition

## 2024-11-15 VITALS — BP 138/84 | HR 68 | Temp 98.3°F | Resp 14 | Wt 179.8 lb

## 2024-11-15 DIAGNOSIS — Z79620 Long term (current) use of immunosuppressive biologic: Secondary | ICD-10-CM | POA: Diagnosis not present

## 2024-11-15 DIAGNOSIS — G620 Drug-induced polyneuropathy: Secondary | ICD-10-CM | POA: Diagnosis not present

## 2024-11-15 DIAGNOSIS — G47419 Narcolepsy without cataplexy: Secondary | ICD-10-CM | POA: Diagnosis not present

## 2024-11-15 DIAGNOSIS — Z79630 Long term (current) use of alkylating agent: Secondary | ICD-10-CM | POA: Diagnosis not present

## 2024-11-15 DIAGNOSIS — Z79899 Other long term (current) drug therapy: Secondary | ICD-10-CM | POA: Diagnosis not present

## 2024-11-15 DIAGNOSIS — Z79631 Long term (current) use of antimetabolite agent: Secondary | ICD-10-CM | POA: Diagnosis not present

## 2024-11-15 DIAGNOSIS — I10 Essential (primary) hypertension: Secondary | ICD-10-CM | POA: Diagnosis not present

## 2024-11-15 DIAGNOSIS — Z5111 Encounter for antineoplastic chemotherapy: Secondary | ICD-10-CM | POA: Diagnosis not present

## 2024-11-15 DIAGNOSIS — C155 Malignant neoplasm of lower third of esophagus: Secondary | ICD-10-CM | POA: Diagnosis present

## 2024-11-15 DIAGNOSIS — T451X5A Adverse effect of antineoplastic and immunosuppressive drugs, initial encounter: Secondary | ICD-10-CM | POA: Diagnosis not present

## 2024-11-15 DIAGNOSIS — C7989 Secondary malignant neoplasm of other specified sites: Secondary | ICD-10-CM | POA: Insufficient documentation

## 2024-11-15 DIAGNOSIS — Z5112 Encounter for antineoplastic immunotherapy: Secondary | ICD-10-CM | POA: Insufficient documentation

## 2024-11-15 DIAGNOSIS — E876 Hypokalemia: Secondary | ICD-10-CM | POA: Insufficient documentation

## 2024-11-15 MED ORDER — PALONOSETRON HCL INJECTION 0.25 MG/5ML
0.2500 mg | Freq: Once | INTRAVENOUS | Status: AC
  Administered 2024-11-15: 0.25 mg via INTRAVENOUS
  Filled 2024-11-15: qty 5

## 2024-11-15 MED ORDER — OXALIPLATIN CHEMO INJECTION 100 MG/20ML
85.0000 mg/m2 | Freq: Once | INTRAVENOUS | Status: AC
  Administered 2024-11-15: 175 mg via INTRAVENOUS
  Filled 2024-11-15: qty 35

## 2024-11-15 MED ORDER — DEXTROSE 5 % IV SOLN: INTRAVENOUS | Status: DC

## 2024-11-15 MED ORDER — LEUCOVORIN CALCIUM INJECTION 350 MG
200.0000 mg/m2 | Freq: Once | INTRAVENOUS | Status: AC
  Administered 2024-11-15: 408 mg via INTRAVENOUS
  Filled 2024-11-15: qty 20.4

## 2024-11-15 MED ORDER — SODIUM CHLORIDE 0.9 % IV SOLN
2600.0000 mg/m2 | INTRAVENOUS | Status: DC
  Administered 2024-11-15: 5000 mg via INTRAVENOUS
  Filled 2024-11-15: qty 100

## 2024-11-15 MED ORDER — SODIUM CHLORIDE 0.9 % IV SOLN
35.0000 mg/m2 | Freq: Once | INTRAVENOUS | Status: AC
  Administered 2024-11-15: 71 mg via INTRAVENOUS
  Filled 2024-11-15: qty 7.1

## 2024-11-15 NOTE — Progress Notes (Signed)
 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  Patient receives FLOT every 14 days and Durvalumab  every 28 days.  Patient is followed by Dr. Lanny.  Weight: 179 pounds 12 ounces January 2 177 pounds 11.2 ounces December 19 191 pounds 11.2 ounces December 3  Labs include Magnesium 2.1, glucose 109, creatinine 1.22  Patient believes she is doing slightly better.  She still does not tolerate foods with any texture.  She has been drinking at least 2 oral nutrition supplements every day.  Also tolerates soups, yogurt, and pudding.  She is drinking water, Gatorade, and liquid IV.  She prefers Actuary.  Denies nausea, vomiting, constipation, and diarrhea.  Nutrition diagnosis: Unintended weight loss improved.  Intervention: Encourage patient to consume small frequent snacks/meals at least 6 times daily. Continue oral nutrition supplements as tolerated.  Increase if weight loss continues. Provided nutrition supplements samples. Encouraged patient to make shakes with oral nutrition supplements and provided multiple recipes.  Monitoring, evaluation, goals: Patient will tolerate increased calories and protein to minimize weight loss.  Next visit: Friday, January 30 during infusion.  **Disclaimer: This note was dictated with voice recognition software. Similar sounding words can inadvertently be transcribed and this note may contain transcription errors which may not have been corrected upon publication of note.**

## 2024-11-15 NOTE — Patient Instructions (Signed)
 [facility] - A DEPT OF [facility]  Discharge Instructions: Thank you for choosing [facility] to provide your oncology and hematology care.   If you have a lab appointment with the Cancer Center, please go directly to the Cancer Center and check in at the registration area.   Wear comfortable clothing and clothing appropriate for easy access to any Portacath or PICC line.   We strive to give you quality time with your provider. You may need to reschedule your appointment if you arrive late (15 or more minutes).  Arriving late affects you and other patients whose appointments are after yours.  Also, if you miss three or more appointments without notifying the office, you may be dismissed from the clinic at the providers discretion.      For prescription refill requests, have your pharmacy contact our office and allow 72 hours for refills to be completed.    Today you received the following chemotherapy and/or immunotherapy agents: FLOT      To help prevent nausea and vomiting after your treatment, we encourage you to take your nausea medication as directed.  BELOW ARE SYMPTOMS THAT SHOULD BE REPORTED IMMEDIATELY: *FEVER GREATER THAN 100.4 F (38 C) OR HIGHER *CHILLS OR SWEATING *NAUSEA AND VOMITING THAT IS NOT CONTROLLED WITH YOUR NAUSEA MEDICATION *UNUSUAL SHORTNESS OF BREATH *UNUSUAL BRUISING OR BLEEDING *URINARY PROBLEMS (pain or burning when urinating, or frequent urination) *BOWEL PROBLEMS (unusual diarrhea, constipation, pain near the anus) TENDERNESS IN MOUTH AND THROAT WITH OR WITHOUT PRESENCE OF ULCERS (sore throat, sores in mouth, or a toothache) UNUSUAL RASH, SWELLING OR PAIN  UNUSUAL VAGINAL DISCHARGE OR ITCHING   Items with * indicate a potential emergency and should be followed up as soon as possible or go to the Emergency Department if any problems should occur.  Please show the CHEMOTHERAPY ALERT CARD or IMMUNOTHERAPY ALERT  CARD at check-in to the Emergency Department and triage nurse.  Should you have questions after your visit or need to cancel or reschedule your appointment, please contact [facility] - A DEPT OF [facility]  Dept: [phone]  and follow the prompts.  Office hours are 8:00 a.m. to 4:30 p.m. Monday - Friday. Please note that voicemails left after 4:00 p.m. may not be returned until the following business day.  We are closed weekends and major holidays. You have access to a nurse at all times for urgent questions. Please call the main number to the clinic Dept: [phone] and follow the prompts.   For any non-urgent questions, you may also contact your provider using MyChart. We now offer e-Visits for anyone 2 and older to request care online for non-urgent symptoms. For details visit [email or web address].   Also download the MyChart app! Go to the app store, search MyChart, open the app, select [place], and log in with your MyChart username and password.

## 2024-11-15 NOTE — Progress Notes (Signed)
 Docetaxel  diluted in NS 150 ml from NS 250mL bag, Onu:[id number],  Exp: 06/13/2026.  Bridgett Leach [place], [place], BCPS, BCOP 11/15/2024 9:00 AM

## 2024-11-15 NOTE — Progress Notes (Signed)
 [facility] CSW Progress Note  Visual Merchandiser met with patient to follow-up on financial concerns.    Interventions: Pt informed CSW she has applied for SNAP benefits, but has not yet been approved.  CSW requested pt reach out when she gets approval for SNAP benefits as she will then be eligible to apply for the [employer].  CSW encouraged pt to check on SNAP benefit application and if a letter from the Cancer Center would be beneficial to expedite approval CSW will provide one.  Pt to email a copy of a bill to CSW to submit for the [employer] on 1/5.  Pt scheduled for counseling 1/8.      Follow Up Plan:  Patient will contact CSW with any support or resource needs    Toni JONELLE Manna, LCSW Clinical Social Worker [facility]

## 2024-11-16 ENCOUNTER — Inpatient Hospital Stay

## 2024-11-16 VITALS — BP 152/91 | HR 96 | Temp 98.3°F | Resp 16

## 2024-11-16 DIAGNOSIS — C155 Malignant neoplasm of lower third of esophagus: Secondary | ICD-10-CM | POA: Diagnosis not present

## 2024-11-16 MED ORDER — PEGFILGRASTIM-JMDB 6 MG/0.6ML ~~LOC~~ SOSY
6.0000 mg | PREFILLED_SYRINGE | Freq: Once | SUBCUTANEOUS | Status: AC
  Administered 2024-11-16: 6 mg via SUBCUTANEOUS
  Filled 2024-11-16: qty 0.6

## 2024-11-21 ENCOUNTER — Encounter: Payer: Self-pay | Admitting: Hematology

## 2024-11-21 ENCOUNTER — Other Ambulatory Visit: Payer: Self-pay

## 2024-11-21 ENCOUNTER — Inpatient Hospital Stay: Admitting: Licensed Clinical Social Worker

## 2024-11-21 DIAGNOSIS — C155 Malignant neoplasm of lower third of esophagus: Secondary | ICD-10-CM

## 2024-11-21 DIAGNOSIS — E860 Dehydration: Secondary | ICD-10-CM | POA: Insufficient documentation

## 2024-11-21 NOTE — Progress Notes (Signed)
 [facility] CSW Counseling Note  Patient was referred by self. Treatment type: Individual  Presenting Concerns: Patient and/or family reports the following symptoms/concerns: anxiety and depression Duration of problem: 2 months; Severity of problem: moderate   Orientation:oriented to person, place, time/date, situation, day of week, month of year, and year.   Affect: Appropriate and Congruent Risk of harm to self or others: No plan to harm self or others  Patient and/or Family's Strengths/Protective Factors: Social connections, Social and Emotional competence, Concrete supports in place (healthy food, safe environments, etc.), and Physical Health (exercise, healthy diet, medication compliance, etc.)Ability for insight  Capable of independent living  Communication skills  Motivation for treatment/growth  Physical Health  Supportive family/friends      Goals Addressed: Patient will:  Reduce symptoms of: anxiety and depression Increase knowledge and/or ability of: coping skills and self-management skills  Increase healthy adjustment to current life circumstances   Progress towards Goals: Initial   Interventions: Interventions utilized:  Solution-Focused Strategies, Supportive Counseling, and Supportive Reflection       11/15/2024    8:13 AM 11/13/2024   10:00 AM 11/01/2024    9:00 AM  PHQ9 SCORE ONLY  PHQ-9 Total Score 0 0 0        No data to display             Assessment: Patient currently experiencing anxiety and depression following cancer diagnosis.  Pt recently received initial diagnosis w/ f/u diagnostics indicating metastatic disease.  Pt w/ significant Pmhx of complex trauma. CSW allowed space for pt to express her feelings regarding current life circumstances.  Pt also shared an overview of past traumas.  CSW and pt discussed how a cancer diagnosis many times triggers feelings from past traumas.  Pt reports she has seen counselors in the past, but has not been able to  find a counselor to assist w/ processing her past trauma.  CSW to research clinical psychologists who accept pt's insurance and will provide pt w/ a list.  CSW discussed identifying those individuals in her life who are a priority and making time and space to allow them in to support her.  Emotional support provided.        Plan: Follow up with CSW: pt to f/u w/ CSW in 2 weeks         Devere JONELLE Manna, LCSW

## 2024-11-23 ENCOUNTER — Inpatient Hospital Stay

## 2024-11-23 VITALS — BP 132/96 | HR 68 | Temp 97.7°F | Resp 18

## 2024-11-23 DIAGNOSIS — C155 Malignant neoplasm of lower third of esophagus: Secondary | ICD-10-CM | POA: Diagnosis not present

## 2024-11-23 DIAGNOSIS — E860 Dehydration: Secondary | ICD-10-CM

## 2024-11-23 MED ORDER — SODIUM CHLORIDE 0.9 % IV SOLN: Freq: Once | INTRAVENOUS | Status: AC

## 2024-11-25 ENCOUNTER — Other Ambulatory Visit (HOSPITAL_BASED_OUTPATIENT_CLINIC_OR_DEPARTMENT_OTHER): Payer: Self-pay

## 2024-11-25 ENCOUNTER — Encounter: Payer: Self-pay | Admitting: Hematology

## 2024-11-25 ENCOUNTER — Inpatient Hospital Stay: Admitting: Hematology

## 2024-11-25 DIAGNOSIS — C155 Malignant neoplasm of lower third of esophagus: Secondary | ICD-10-CM | POA: Diagnosis not present

## 2024-11-25 MED ORDER — ALPRAZOLAM 0.25 MG PO TABS
0.2500 mg | ORAL_TABLET | Freq: Three times a day (TID) | ORAL | 0 refills | Status: AC | PRN
  Filled 2024-11-25: qty 30, 10d supply, fill #0

## 2024-11-25 NOTE — Progress Notes (Signed)
 " [facility]   Telephone:(336) [phone] Fax:(336) [phone]   Clinic Follow up Note   Patient Care Team: Sheldon Netter, [name] as PCP - General (Physician Assistant) 11/25/2024  I connected with Vena Gull on 11/25/2024 at  7:45 AM EST by telephone and verified that I am speaking with the correct person using two identifiers.   I discussed the limitations, risks, security and privacy concerns of performing an evaluation and management service by telephone and the availability of in person appointments. I also discussed with the patient that there may be a patient responsible charge related to this service. The patient expressed understanding and agreed to proceed.   Patient's location:  Home  Provider's location:  Office    CHIEF COMPLAINT: Follow-up esophageal ulcer   CURRENT THERAPY: pending FOLFOX and Nivo   Oncology history Esophageal cancer (HCC) [id number] with oligo right gluteal metastasis, MMR normal, Her2 (-), PD-L1 CPS 100% - Patient presented with progressive dysphagia and a 50 pound weight loss -EGD showed a large ulcerated, partially obstructed mass in the distal esophagus, extending into GE junction and gastric cardia, biopsy showed poorly differentiated adenocarcinoma. -PET scan was denied by insurance, CT showed enlarged mediastinal adenopathy, US  staging T3 N2 -I recommend neoadjuvant chemotherapy FLOT and durvalumab , she started on 10/17/2024 -PET on 10/12/2024 showed hypermetabolic distal esophageal mass, and hypermetabolic activity in the right gluteal muscular.  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 The disease is  not curable but is treatable. Her tumor is highly PD-L1 positive, increasing the likelihood of response to immunotherapy. Surgery is not recommended due to metastatic disease. Prognosis is variable, with median survival for metastatic esophageal cancer around 1.5 years; however, strong PD-L1 positivity and limited metastatic burden may improve her outlook, with up to a 20% chance of cure if she responds well to therapy. - Planned to switch chemotherapy regimen to FOLFOX plus nivolumab  every two weeks, as this is more tolerable than FLOT. - Planned to evaluate response to therapy every three months with CT or PET imaging. - Planned PET scan after 4-6 months of therapy to assess for possible local radiation therapy. - Planned to continue immunotherapy for up to two years if she responds well and remains without evidence of disease on imaging and blood work. - Planned to discontinue treatment and observe if she remains disease-free after two years of therapy. - Planned to reschedule chemotherapy to Thursday if available, otherwise to next week, due to clinic closure on Sunday. - Planned to obtain insurance approval for the new regimen. - Planned referral to radiation oncology for consideration of local radiation therapy after chemotherapy if she responds well. - Planned to continue follow-up and supportive care.  Severe anxiety related to cancer diagnosis She experiences severe anxiety related to her cancer diagnosis, with episodes of panic, dyspnea, and daytime functional impairment. 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 She prefers a single provider to manage her anxiolytic medications. - Prescribed alprazolam  as needed for panic attacks, with instructions to use the minimal effective dose and not exceed three times daily. - Advised regarding risk of dependence and sedation with alprazolam , and to avoid driving for 3-4 hours  after dosing, especially when initiating therapy. -  Discussed that clonazepam is a stronger medication and not preferred for her situation. - Sent prescription to [facility] pharmacy. - Advised to continue using the same pharmacy and provider for controlled substances. - Offered referral to social work, which she plans to follow up next week.  Plan - I discussed her recent biopsy results, her current stage, prognosis, and overall treatment plan. - Will change her chemo to FOLFOX and nivolumab , plan to start later this week or early next week - Follow-up with second cycle chemo - Ordered Tempus on her biopsy   SUMMARY OF ONCOLOGIC HISTORY: Oncology History  Esophageal cancer (HCC)  09/18/2024 Cancer Staging   Staging form: Esophagus - Adenocarcinoma, AJCC 8th Edition - Clinical stage from 09/18/2024: Stage IVA (cT3, cN2, cM0, G3) - Signed by Lanny Callander, MD on 10/03/2024 Histologic grading system: 3 grade system   10/03/2024 Initial Diagnosis   Esophageal cancer (HCC)   10/17/2024 - 11/16/2024 Chemotherapy   Patient is on Treatment Plan : GASTROESOPHAGEAL Durvalumab  q28d + FLOT q14d / Durvalumab  q28d     11/28/2024 -  Chemotherapy   Patient is on Treatment Plan : GASTROESOPHAGEAL FOLFOX + Nivolumab  q14d       Discussed the use of AI scribe software for clinical note transcription with the patient, who gave verbal consent to proceed.  History of Present Illness Toni Dougherty is a 54 year old female with stage IV esophageal cancer who presents for oncology follow-up to discuss recent biopsy results and management of severe anxiety.  PET and biopsy confirmed a new metastatic lesion in the right buttock consistent with esophageal primary. This is the only distant metastasis outside the esophagus and lymph nodes. She has no buttock pain or discomfort and no new systemic symptoms, including weight loss, fatigue, or fevers. She remains ambulatory and continues to walk regularly.  Her tumor is strongly PD-L1  positive.  She has severe daytime anxiety with panic episodes, dyspnea, and functional impairment, with difficulty calming herself. She denies sleep disturbance and attributes her sleep pattern to narcolepsy. She prefers to have a single provider manage her anxiolytic medications.     REVIEW OF SYSTEMS:   Constitutional: Denies fevers, chills or abnormal weight loss Eyes: Denies blurriness of vision Ears, nose, mouth, throat, and face: Denies mucositis or sore throat Respiratory: Denies cough, dyspnea or wheezes Cardiovascular: Denies palpitation, chest discomfort or lower extremity swelling Gastrointestinal:  Denies nausea, heartburn or change in bowel habits Skin: Denies abnormal skin rashes Lymphatics: Denies new lymphadenopathy or easy bruising Neurological:Denies numbness, tingling or new weaknesses Behavioral/Psych: Mood is stable, no new changes  All other systems were reviewed with the patient and are negative.  MEDICAL HISTORY:  Past Medical History:  Diagnosis Date   ADD (attention deficit disorder)    Cancer (HCC)    HTN (hypertension)    Narcolepsy     SURGICAL HISTORY: Past Surgical History:  Procedure Laterality Date   APPENDECTOMY     ESOPHAGOGASTRODUODENOSCOPY N/A 10/02/2024   Procedure: EGD (ESOPHAGOGASTRODUODENOSCOPY);  Surgeon: Burnette Fallow, MD;  Location: [facility];  Service: Gastroenterology;  Laterality: N/A;   EUS N/A 10/02/2024   Procedure: ULTRASOUND, UPPER GI TRACT, ENDOSCOPIC;  Surgeon: Burnette Fallow, MD;  Location: [facility];  Service: Gastroenterology;  Laterality: N/A;   IR IMAGING GUIDED PORT INSERTION  10/09/2024   rotate cuff Right 2012    I have reviewed the social history and family history with the patient and they are unchanged from  previous note.  ALLERGIES:  is allergic to penicillins and erythromycin.  MEDICATIONS:  Current Outpatient Medications  Medication Sig Dispense Refill   ALPRAZolam  (XANAX ) 0.25 MG tablet Take  1 tablet (0.25 mg total) by mouth 3 (three) times daily as needed for anxiety. 30 tablet 0   amLODipine  (NORVASC ) 5 MG tablet Take 1 tablet (5 mg total) by mouth daily. (Patient not taking: Reported on 10/25/2024) 90 tablet 1   amLODipine  (NORVASC ) 5 MG tablet Take 2 tablets (10 mg total) by mouth daily. 180 tablet 0   amphetamine -dextroamphetamine  (ADDERALL) 30 MG tablet 1 tablet Orally Twice a day (Fill date 10/15/2024) 60 tablet 0   diphenoxylate -atropine  (LOMOTIL ) 2.5-0.025 MG tablet Take 1-2 tablets by mouth 4 (four) times daily as needed for diarrhea or loose stools. 60 tablet 1   loperamide  (IMODIUM ) 2 MG capsule Take 1 capsule (2 mg total) by mouth as needed for diarrhea or loose stools.     potassium chloride  SA (KLOR-CON  M) 20 MEQ tablet Take 1 tablet (20 mEq total) by mouth 2 (two) times daily. 30 tablet 1   [Paused] rosuvastatin  (CRESTOR ) 20 MG tablet Take 1 tablet (20 mg total) by mouth daily. (Patient not taking: Reported on 10/25/2024) 90 tablet 1   saccharomyces boulardii (FLORASTOR) 250 MG capsule Take 1 capsule (250 mg total) by mouth 2 (two) times daily. 60 capsule 0   sucralfate  (CARAFATE ) 1 g tablet Take 1 tablet (1 g total) by mouth 2 (two) times daily on an empty stomach. 60 tablet 1   triamcinolone  cream (KENALOG ) 0.1 % Apply to affected area(s) twice daily as needed 30 g 1   No current facility-administered medications for this visit.    PHYSICAL EXAMINATION: Not performed   LABORATORY DATA:  I have reviewed the data as listed    Latest Ref Rng & Units 11/13/2024    9:29 AM 11/06/2024   10:14 AM 11/01/2024    9:31 AM  CBC  WBC 4.0 - 10.5 K/uL 8.1  8.1  9.9   Hemoglobin 12.0 - 15.0 g/dL 87.0  86.4  86.0   Hematocrit 36.0 - 46.0 % 37.9  37.9  39.8   Platelets 150 - 400 K/uL 302  265  202         Latest Ref Rng & Units 11/13/2024    9:29 AM 11/06/2024   10:14 AM 11/01/2024    9:31 AM  CMP  Glucose 70 - 99 mg/dL 890  880  99   BUN 6 - 20 mg/dL 16  8  16     Creatinine 0.44 - 1.00 mg/dL 8.77  8.96  7.84   Sodium 135 - 145 mmol/L 138  140  139   Potassium 3.5 - 5.1 mmol/L 4.6  3.3  3.0   Chloride 98 - 111 mmol/L 105  106  104   CO2 22 - 32 mmol/L 23  23  20    Calcium  8.9 - 10.3 mg/dL 9.3  8.7  8.8   Total Protein 6.5 - 8.1 g/dL 7.2  6.9  7.1   Total Bilirubin 0.0 - 1.2 mg/dL 0.4  0.4  0.4   Alkaline Phos 38 - 126 U/L 66  75  78   AST 15 - 41 U/L 21  21  23    ALT 0 - 44 U/L 13  15  16        RADIOGRAPHIC STUDIES: I have personally reviewed the radiological images as listed and agreed with the findings in the report. No results  found.     I discussed the assessment and treatment plan with the patient. The patient was provided an opportunity to ask questions and all were answered. The patient agreed with the plan and demonstrated an understanding of the instructions.   The patient was advised to call back or seek an in-person evaluation if the symptoms worsen or if the condition fails to improve as anticipated.  I provided 40 minutes of non face-to-face telephone visit time during this encounter, including review of chart and various tests results, discussions about plan of care and coordination of care plan.    Onita Mattock, MD 11/25/2024     "

## 2024-11-25 NOTE — Progress Notes (Signed)
 DISCONTINUE ON PATHWAY REGIMEN - Gastroesophageal     Cycles 1 through 4: A cycle is every 28 days:     Durvalumab       Docetaxel       Oxaliplatin       Leucovorin       Fluorouracil     Cycles 5 through 14: A cycle is every 28 days:     Durvalumab    **Always confirm dose/schedule in your pharmacy ordering system**  PRIOR TREATMENT: [id number]: Durvalumab  + FLOT x 2 Months Preoperative, Durvalumab  + FLOT x 2 Months Postoperative, Followed by Durvalumab  1,500 mg q28 Days x 10 Cycles  START ON PATHWAY REGIMEN - Gastroesophageal     A cycle is every 14 days:     Nivolumab       Oxaliplatin       Leucovorin       Fluorouracil       Fluorouracil    **Always confirm dose/schedule in your pharmacy ordering system**  Patient Characteristics: Distant Metastases (cM1/pM1) / Locally Recurrent Disease, Adenocarcinoma - Esophageal, GE Junction, and Gastric, First Line, HER2 Negative/Unknown, PD?L1 Expression Positive CPS ? 5, Nivolumab  Preferred Disease Classification: GE Junction Histology: Adenocarcinoma Therapeutic Status: Distant Metastases (No Additional Staging) Line of Therapy: First Line HER2 Status: Negative PD-L1 Expression Status: PD-L1 Expression Positive CPS ? 5 Intent of Therapy: Non-Curative / Palliative Intent, Discussed with Patient

## 2024-11-25 NOTE — Assessment & Plan Note (Signed)
 [id number] with oligo right gluteal metastasis, MMR normal, Her2 (-), PD-L1 CPS 100% - Patient presented with progressive dysphagia and a 50 pound weight loss -EGD showed a large ulcerated, partially obstructed mass in the distal esophagus, extending into GE junction and gastric cardia, biopsy showed poorly differentiated adenocarcinoma. -PET scan was denied by insurance, CT showed enlarged mediastinal adenopathy, US  staging T3 N2 -I recommend neoadjuvant chemotherapy FLOT and durvalumab , she started on 10/17/2024 -PET on 10/12/2024 showed hypermetabolic distal esophageal mass, and hypermetabolic activity in the right gluteal muscular.  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 -Plan to change her treatment to FOLFOX and Nivo, will request [facility] on her biopsy

## 2024-11-26 ENCOUNTER — Other Ambulatory Visit (HOSPITAL_BASED_OUTPATIENT_CLINIC_OR_DEPARTMENT_OTHER): Payer: Self-pay

## 2024-11-26 ENCOUNTER — Other Ambulatory Visit: Payer: Self-pay

## 2024-11-27 ENCOUNTER — Other Ambulatory Visit: Payer: Self-pay

## 2024-11-28 ENCOUNTER — Inpatient Hospital Stay

## 2024-11-28 VITALS — BP 131/95 | HR 80 | Temp 98.2°F | Resp 16 | Wt 172.8 lb

## 2024-11-28 DIAGNOSIS — C155 Malignant neoplasm of lower third of esophagus: Secondary | ICD-10-CM

## 2024-11-28 LAB — CMP (CANCER CENTER ONLY)
ALT: 12 U/L (ref 0–44)
AST: 22 U/L (ref 15–41)
Albumin: 3.9 g/dL (ref 3.5–5.0)
Alkaline Phosphatase: 105 U/L (ref 38–126)
Anion gap: 15 (ref 5–15)
BUN: 22 mg/dL — ABNORMAL HIGH (ref 6–20)
CO2: 21 mmol/L — ABNORMAL LOW (ref 22–32)
Calcium: 8.6 mg/dL — ABNORMAL LOW (ref 8.9–10.3)
Chloride: 102 mmol/L (ref 98–111)
Creatinine: 1.99 mg/dL — ABNORMAL HIGH (ref 0.44–1.00)
GFR, Estimated: 29 mL/min — ABNORMAL LOW
Glucose, Bld: 101 mg/dL — ABNORMAL HIGH (ref 70–99)
Potassium: 3.4 mmol/L — ABNORMAL LOW (ref 3.5–5.1)
Sodium: 138 mmol/L (ref 135–145)
Total Bilirubin: 0.4 mg/dL (ref 0.0–1.2)
Total Protein: 7.1 g/dL (ref 6.5–8.1)

## 2024-11-28 LAB — CBC WITH DIFFERENTIAL (CANCER CENTER ONLY)
Abs Immature Granulocytes: 0.5 K/uL — ABNORMAL HIGH (ref 0.00–0.07)
Basophils Absolute: 0.1 K/uL (ref 0.0–0.1)
Basophils Relative: 1 %
Eosinophils Absolute: 0.1 K/uL (ref 0.0–0.5)
Eosinophils Relative: 1 %
HCT: 35.2 % — ABNORMAL LOW (ref 36.0–46.0)
Hemoglobin: 12.1 g/dL (ref 12.0–15.0)
Immature Granulocytes: 4 %
Lymphocytes Relative: 23 %
Lymphs Abs: 2.8 K/uL (ref 0.7–4.0)
MCH: 30.9 pg (ref 26.0–34.0)
MCHC: 34.4 g/dL (ref 30.0–36.0)
MCV: 89.8 fL (ref 80.0–100.0)
Monocytes Absolute: 0.9 K/uL (ref 0.1–1.0)
Monocytes Relative: 7 %
Neutro Abs: 7.6 K/uL (ref 1.7–7.7)
Neutrophils Relative %: 64 %
Platelet Count: 195 K/uL (ref 150–400)
RBC: 3.92 MIL/uL (ref 3.87–5.11)
RDW: 14.8 % (ref 11.5–15.5)
WBC Count: 11.9 K/uL — ABNORMAL HIGH (ref 4.0–10.5)
nRBC: 0.3 % — ABNORMAL HIGH (ref 0.0–0.2)

## 2024-11-28 LAB — TSH: TSH: 1.36 u[IU]/mL (ref 0.350–4.500)

## 2024-11-28 MED ORDER — SODIUM CHLORIDE 0.9 % IV SOLN
240.0000 mg | Freq: Once | INTRAVENOUS | Status: AC
  Administered 2024-11-28: 240 mg via INTRAVENOUS
  Filled 2024-11-28: qty 24

## 2024-11-28 MED ORDER — LEUCOVORIN CALCIUM INJECTION 350 MG
400.0000 mg/m2 | Freq: Once | INTRAVENOUS | Status: AC
  Administered 2024-11-28: 812 mg via INTRAVENOUS
  Filled 2024-11-28: qty 25

## 2024-11-28 MED ORDER — DEXAMETHASONE SOD PHOSPHATE PF 10 MG/ML IJ SOLN
10.0000 mg | Freq: Once | INTRAMUSCULAR | Status: AC
  Administered 2024-11-28: 10 mg via INTRAVENOUS
  Filled 2024-11-28: qty 1

## 2024-11-28 MED ORDER — SODIUM CHLORIDE 0.9 % IV SOLN: Freq: Once | INTRAVENOUS | Status: AC

## 2024-11-28 MED ORDER — PALONOSETRON HCL INJECTION 0.25 MG/5ML
0.2500 mg | Freq: Once | INTRAVENOUS | Status: AC
  Administered 2024-11-28: 0.25 mg via INTRAVENOUS
  Filled 2024-11-28: qty 5

## 2024-11-28 MED ORDER — OXALIPLATIN CHEMO INJECTION 100 MG/20ML
85.0000 mg/m2 | Freq: Once | INTRAVENOUS | Status: AC
  Administered 2024-11-28: 175 mg via INTRAVENOUS
  Filled 2024-11-28: qty 35

## 2024-11-28 MED ORDER — DEXTROSE 5 % IV SOLN: INTRAVENOUS | Status: DC

## 2024-11-28 MED ORDER — SODIUM CHLORIDE 0.9 % IV SOLN
2400.0000 mg/m2 | INTRAVENOUS | Status: DC
  Administered 2024-11-28: 5000 mg via INTRAVENOUS
  Filled 2024-11-28: qty 100

## 2024-11-28 MED ORDER — SODIUM CHLORIDE 0.9 % IV SOLN: INTRAVENOUS | Status: DC

## 2024-11-28 NOTE — Patient Instructions (Signed)
 [facility] - A DEPT OF [facility]  Discharge Instructions: Thank you for choosing [facility] to provide your oncology and hematology care.   If you have a lab appointment with the Cancer Center, please go directly to the Cancer Center and check in at the registration area.   Wear comfortable clothing and clothing appropriate for easy access to any Portacath or PICC line.   We strive to give you quality time with your provider. You may need to reschedule your appointment if you arrive late (15 or more minutes).  Arriving late affects you and other patients whose appointments are after yours.  Also, if you miss three or more appointments without notifying the office, you may be dismissed from the clinic at the providers discretion.      For prescription refill requests, have your pharmacy contact our office and allow 72 hours for refills to be completed.    Today you received the following chemotherapy and/or immunotherapy agents: Nivolumab , Oxaliplatin , Leucovorin , 5FU      To help prevent nausea and vomiting after your treatment, we encourage you to take your nausea medication as directed.  BELOW ARE SYMPTOMS THAT SHOULD BE REPORTED IMMEDIATELY: *FEVER GREATER THAN 100.4 F (38 C) OR HIGHER *CHILLS OR SWEATING *NAUSEA AND VOMITING THAT IS NOT CONTROLLED WITH YOUR NAUSEA MEDICATION *UNUSUAL SHORTNESS OF BREATH *UNUSUAL BRUISING OR BLEEDING *URINARY PROBLEMS (pain or burning when urinating, or frequent urination) *BOWEL PROBLEMS (unusual diarrhea, constipation, pain near the anus) TENDERNESS IN MOUTH AND THROAT WITH OR WITHOUT PRESENCE OF ULCERS (sore throat, sores in mouth, or a toothache) UNUSUAL RASH, SWELLING OR PAIN  UNUSUAL VAGINAL DISCHARGE OR ITCHING   Items with * indicate a potential emergency and should be followed up as soon as possible or go to the Emergency Department if any problems should occur.  Please show the  CHEMOTHERAPY ALERT CARD or IMMUNOTHERAPY ALERT CARD at check-in to the Emergency Department and triage nurse.  Should you have questions after your visit or need to cancel or reschedule your appointment, please contact [facility] - A DEPT OF [facility]  Dept: [phone]  and follow the prompts.  Office hours are 8:00 a.m. to 4:30 p.m. Monday - Friday. Please note that voicemails left after 4:00 p.m. may not be returned until the following business day.  We are closed weekends and major holidays. You have access to a nurse at all times for urgent questions. Please call the main number to the clinic Dept: [phone] and follow the prompts.   For any non-urgent questions, you may also contact your provider using MyChart. We now offer e-Visits for anyone 73 and older to request care online for non-urgent symptoms. For details visit [email or web address].   Also download the MyChart app! Go to the app store, search MyChart, open the app, select [facility], and log in with your MyChart username and password.  Nivolumab  Injection What is this medication? NIVOLUMAB  (nye VOL ue mab) treats some types of cancer. It works by helping your immune system slow or stop the spread of cancer cells. It is a monoclonal antibody. This medicine may be used for other purposes; ask your health care provider or pharmacist if you have questions. COMMON BRAND NAME(S): Opdivo  What should I tell my care team before I take this medication? They need to know if you have any of these conditions: Allogeneic stem cell transplant (uses someone else's  stem cells) Autoimmune diseases, such as Crohn disease, ulcerative colitis, lupus History of chest radiation Nervous system problems, such as Guillain-Barre syndrome or myasthenia gravis Organ transplant An unusual or allergic reaction to nivolumab , other medications, foods, dyes, or preservatives Pregnant or trying to get  pregnant Breast-feeding How should I use this medication? This medication is infused into a vein. It is given in a hospital or clinic setting. A special MedGuide will be given to you before each treatment. Be sure to read this information carefully each time. Talk to your care team about the use of this medication in children. While it may be prescribed for children as young as 12 years for selected conditions, precautions do apply. Overdosage: If you think you have taken too much of this medicine contact a poison control center or emergency room at once. NOTE: This medicine is only for you. Do not share this medicine with others. What if I miss a dose? Keep appointments for follow-up doses. It is important not to miss your dose. Call your care team if you are unable to keep an appointment. What may interact with this medication? Interactions have not been studied. This list may not describe all possible interactions. Give your health care provider a list of all the medicines, herbs, non-prescription drugs, or dietary supplements you use. Also tell them if you smoke, drink alcohol, or use illegal drugs. Some items may interact with your medicine. What should I watch for while using this medication? Your condition will be monitored carefully while you are receiving this medication. You may need blood work while taking this medication. This medication may cause serious skin reactions. They can happen weeks to months after starting the medication. Contact your care team right away if you notice fevers or flu-like symptoms with a rash. The rash may be red or purple and then turn into blisters or peeling of the skin. You may also notice a red rash with swelling of the face, lips, or lymph nodes in your neck or under your arms. Tell your care team right away if you have any change in your eyesight. Talk to your care team if you are pregnant or think you might be pregnant. A negative pregnancy test is  required before starting this medication. A reliable form of contraception is recommended while taking this medication and for 5 months after the last dose. Talk to your care team about effective forms of contraception. Do not breast-feed while taking this medication and for 5 months after the last dose. What side effects may I notice from receiving this medication? Side effects that you should report to your care team as soon as possible: Allergic reactions--skin rash, itching, hives, swelling of the face, lips, tongue, or throat Dry cough, shortness of breath or trouble breathing Eye pain, redness, irritation, or discharge with blurry or decreased vision Heart muscle inflammation--unusual weakness or fatigue, shortness of breath, chest pain, fast or irregular heartbeat, dizziness, swelling of the ankles, feet, or hands Hormone gland problems--headache, sensitivity to light, unusual weakness or fatigue, dizziness, fast or irregular heartbeat, increased sensitivity to cold or heat, excessive sweating, constipation, hair loss, increased thirst or amount of urine, tremors or shaking, irritability Infusion reactions--chest pain, shortness of breath or trouble breathing, feeling faint or lightheaded Kidney injury (glomerulonephritis)--decrease in the amount of urine, red or dark brown urine, foamy or bubbly urine, swelling of the ankles, hands, or feet Liver injury--right upper belly pain, loss of appetite, nausea, light-colored stool, dark yellow or brown urine,  yellowing skin or eyes, unusual weakness or fatigue Pain, tingling, or numbness in the hands or feet, muscle weakness, change in vision, confusion or trouble speaking, loss of balance or coordination, trouble walking, seizures Rash, fever, and swollen lymph nodes Redness, blistering, peeling, or loosening of the skin, including inside the mouth Sudden or severe stomach pain, bloody diarrhea, fever, nausea, vomiting Side effects that usually do  not require medical attention (report these to your care team if they continue or are bothersome): Bone, joint, or muscle pain Diarrhea Fatigue Loss of appetite Nausea Skin rash This list may not describe all possible side effects. Call your doctor for medical advice about side effects. You may report side effects to FDA at 1-800-FDA-1088. Where should I keep my medication? This medication is given in a hospital or clinic. It will not be stored at home. NOTE: This sheet is a summary. It may not cover all possible information. If you have questions about this medicine, talk to your doctor, pharmacist, or health care provider.  2024 Elsevier/Gold Standard (2022-02-28 00:00:00)

## 2024-11-28 NOTE — Progress Notes (Signed)
 Patient to receive IVF in addition to Tx today due to elevated Cr 1.99, per Dr. Lanny.  Patient encouraged to increase PO fluids at home.  Patient reports improvement in diarrhea since last Tx, reporting no more than two episodes in a given day, managed with Immodium.  MD aware.

## 2024-11-29 ENCOUNTER — Inpatient Hospital Stay

## 2024-11-29 ENCOUNTER — Inpatient Hospital Stay: Admitting: Hematology

## 2024-11-29 ENCOUNTER — Telehealth: Payer: Self-pay

## 2024-11-29 LAB — T4: T4, Total: 10 ug/dL (ref 4.5–12.0)

## 2024-11-29 NOTE — Telephone Encounter (Signed)
 Dr. Lanny - first time nivolumab  f/u call - pt tolerated well Received: Nilsa Metro Katrinka DELENA, RN  P Onc Triage Nurse [facility] Caller: Unspecified (Yesterday,  4:25 PM)

## 2024-11-30 ENCOUNTER — Inpatient Hospital Stay

## 2024-12-05 ENCOUNTER — Inpatient Hospital Stay: Admitting: Licensed Clinical Social Worker

## 2024-12-05 DIAGNOSIS — C155 Malignant neoplasm of lower third of esophagus: Secondary | ICD-10-CM

## 2024-12-06 ENCOUNTER — Encounter: Payer: Self-pay | Admitting: Hematology

## 2024-12-06 ENCOUNTER — Encounter: Payer: Self-pay | Admitting: Nurse Practitioner

## 2024-12-06 NOTE — Progress Notes (Signed)
 [facility] CSW Counseling Note  Patient was referred by self. Treatment type: Individual  Presenting Concerns: Patient and/or family reports the following symptoms/concerns: anxiety and depression Duration of problem: 2 months; Severity of problem: moderate   Orientation:oriented to person, place, time/date, situation, day of week, month of year, and year.   Affect: Appropriate and Congruent Risk of harm to self or others: No plan to harm self or others  Patient and/or Family's Strengths/Protective Factors: Social connections, Social and Emotional competence, Concrete supports in place (healthy food, safe environments, etc.), and Physical Health (exercise, healthy diet, medication compliance, etc.)Ability for insight  Capable of independent living  Communication skills  Motivation for treatment/growth  Physical Health  Supportive family/friends      Goals Addressed: Patient will:  Reduce symptoms of: anxiety and depression Increase knowledge and/or ability of: coping skills and self-management skills  Increase healthy adjustment to current life circumstances   Progress towards Goals: Initial   Interventions: Interventions utilized:  Solution-Focused Strategies, Supportive Counseling, and Supportive Reflection       11/28/2024    9:30 AM 11/15/2024    8:13 AM 11/13/2024   10:00 AM  PHQ9 SCORE ONLY  PHQ-9 Total Score 0 0 0        No data to display             Assessment: Patient currently experiencing anxiety and depression following cancer diagnosis.  Pt recently received initial diagnosis w/ f/u diagnostics indicating metastatic disease.  Pt w/ significant Pmhx of complex trauma. Pt reports tolerating chemo much better and has been able to continue to work.  Pt states she feels more comfortable w/ treatment and has had the opportunity to speak w/ Dr. Lanny and is more confident regarding treatment.  Pt was able to eat a quarter of a hamburger which she has not been able to  do for some time which was very encouraging.  Pt is hopeful this is a sign that the tumor has shrunk due to treatment.  Pt reports she will be moving in the next couple of weeks to a more stable environment and will be better supported which has helped tremendously w/ anxiety as well.  CSW discussed identifying those individuals in her life who are a priority and making time and space to allow them in to support her.  Emotional support provided.        Plan: Follow up with CSW: pt to f/u w/ CSW in 2 weeks         Devere JONELLE Manna, LCSW

## 2024-12-10 NOTE — Assessment & Plan Note (Signed)
 [id number] with oligo right gluteal metastasis, MMR normal, Her2 (-), PD-L1 CPS 100% - Patient presented with progressive dysphagia and a 50 pound weight loss -EGD showed a large ulcerated, partially obstructed mass in the distal esophagus, extending into GE junction and gastric cardia, biopsy showed poorly differentiated adenocarcinoma. -PET scan was denied by insurance, CT showed enlarged mediastinal adenopathy, US  staging T3 N2 -I recommend neoadjuvant chemotherapy FLOT and durvalumab , she started on 10/17/2024 -PET on 10/12/2024 showed hypermetabolic distal esophageal mass, and hypermetabolic activity in the right gluteal muscular.  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 -Plan to change her treatment to FOLFOX and Nivo, will request [facility] on her biopsy  -12/11/2024 -tomorrow, she will have cycle 2 day 1 chemotherapy FOLFOX and nivolumab .  She is experiencing significant performance following treatment.  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

## 2024-12-10 NOTE — Progress Notes (Unsigned)
 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 -PET scan was denied by insurance, CT showed enlarged mediastinal adenopathy, US  staging T3 N2 -I recommend neoadjuvant chemotherapy FLOT and durvalumab , she started on 10/17/2024 -PET on 10/12/2024 showed hypermetabolic distal esophageal mass, and hypermetabolic activity in the right gluteal muscular.  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 -Plan to change her treatment to FOLFOX and Nivo, will request [facility] on her biopsy  -12/11/2024 -tomorrow, she will have cycle 2 day 1 chemotherapy FOLFOX and nivolumab .  She is experiencing significant performance following treatment.  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    Chemotherapy induced peripheral neuropathy  Patient reporting significant peripheral neuropathy during and after initial treatment with FOLFOX. She states that hands felt very tight and burning. She experienced numbness in her feet and lower legs. She also reports cold sensitivity. Previously, when receiving chemotherapy FLOT, she was getting ice packs to hands and feet. She did not have problems with significant neuropathy. She did not have ice bath for extremities when she received FOLFOX. I have added ice packs/baths to extremities during chemotherapy FOLFOX to her treatment plan. She should restart this  intervention beginning with cycle 2 day 1 of FOLFOX.  Will trial gabapentin  to improve burning and buzzing sensation in the extremities. This will be started at 100 mg twice daily. Advised her to use this with caution, especially when having to work or drive. This medication may cause dizziness or drowsiness. She voiced understanding and agreement.   Hypokalemia Potassium is 3.1 today. Will add 10 mEq intravenously to her chemotherapy treatment tomorrow. Advised her to take previously prescribed oral potassium twice daily to help prevent worsening hypokalemia following chemotherapy.   Diarrhea Likely related to chemotherapy and contributing to hypokalemia. She does treat at home with imodium . If that is ineffective, she is able to use lomotil  when needed. Encouraged aggressive oral hydration with electrolyte replacement such as liquid IV or Gatorade.   Decreased appetite Due to cancer and chemotherapy treatment. No weight loss noted. She is trying to consume several small meals each day, keeping up protein and caloric intake. She does meet with nutritionist routinely.   Esophageal cancer Patient to receive cycle 2 day 1 chemotherapy FOLFOX with nivolumab  tomorrow, 12/12/2024.  Overall, managing chemotherapy well.  Restaging PET scan to be done in early March 2026.  This was ordered as part of today's visit.  Will continue with chemotherapy and immunotherapy as scheduled.  Plan Labs reviewed. - Mild and stable anemia. - Moderate hypokalemia with K of 3.1 today. -- Will add 10 mEq KCl with tomorrow's chemotherapy treatment.  Encouraged twice daily dosing of potassium at home. Add ice baths to extremities to help prevent peripheral neuropathy. Add gabapentin  100 mg twice daily to treat symptoms of peripheral neuropathy. Restaging PET scan for early March 2026 ordered as part of today's visit. Patient labs and presentation are appropriate for treatment tomorrow. - Proceed with cycle 2 day  1  chemotherapy with FOLFOX and nivolumab . Continue with current treatment plan as scheduled.  The patient understands the plans discussed today and is in agreement with them.  She knows to contact our office if she develops concerns prior to her next appointment.  I provided 25 minutes of face-to-face time during this encounter and > 50% was spent counseling as documented under my assessment and plan.    [name], NP  [facility] - A DEPT OF [facility] [address] [place] [address] Dept: [phone] Dept Fax: [phone]   Orders Placed This Encounter  Procedures   NM PET Image Restag (PS) Skull Base To Thigh    Standing Status:   Future    Expected Date:   01/13/2025    Expiration Date:   12/11/2025    If indicated for the ordered procedure, I authorize the administration of a radiopharmaceutical per Radiology protocol:   Yes    Is the patient pregnant?:   No    Preferred imaging location?:   [facility]      CHIEF COMPLAINT:  CC: esophageal cancer   Current Treatment:  FOLFOX and Nivoloumab  INTERVAL HISTORY:  Toni Dougherty is here today for repeat clinical assessment. She last had a phone visit with Dr. Lanny on 11/25/2024. She had  Cycle 1 FOLFOX with Nivo on 11/28/2024. Tomorrow, she will have Cycle 2 day 1 chemotherapy FOLFOX and nivolumab .  Overall, she feels like she tolerated initial cycle FOLFOX well.  Did experience severe peripheral neuropathy in both hands and feet.  She reports that she had ice baths to hands and feet during chemotherapy with that but did not receive with chemotherapy FOLFOX.  This has been added to subsequent treatments, starting tomorrow.  She is agreeable to trial of gabapentin  to help with symptoms of neuropathy.  She does have decreased appetite.  Trying to eat several small meals every day to keep up calorie and protein intake.  Today, she reports feeling poorly.  Feels fatigued  and generally weak.  Does request a work note to stay home today.  This was provided during today's visit.  She denies chest pain, chest pressure, or shortness of breath. She denies headaches or visual disturbances. She denies abdominal pain.  She does have some nausea.  She continues to use prescribed antiemetics to treat the symptoms.  She also has frequent diarrhea related to chemotherapy.  She will use Imodium  as needed.  If this is not effective, does have prescription for Lomotil  to use when needed. She denies fevers or chills. Her appetite is poor. Her weight has increased 4 pounds over last 2 weeks.  I have reviewed the past medical history, past surgical history, social history and family history with the patient and they are unchanged from previous note.  ALLERGIES:  is allergic to penicillins and erythromycin.  MEDICATIONS:  Current Outpatient Medications  Medication Sig Dispense Refill   ALPRAZolam  (XANAX ) 0.25 MG tablet Take 1 tablet (0.25 mg total) by mouth 3 (three) times daily as needed for anxiety. 30 tablet 0   amLODipine  (NORVASC ) 5 MG tablet Take 2 tablets (10 mg total) by mouth daily. 180 tablet 0   amphetamine -dextroamphetamine  (ADDERALL) 30 MG tablet Take 1 tablet by mouth 2 (two) times daily. 60 tablet 0   diphenoxylate -atropine  (LOMOTIL ) 2.5-0.025 MG tablet Take 1-2 tablets by mouth 4 (four) times daily as needed for diarrhea or loose stools. 60 tablet  1   gabapentin  (NEURONTIN ) 100 MG capsule Take 1 capsule (100 mg total) by mouth 2 (two) times daily. 60 capsule 0   loperamide  (IMODIUM ) 2 MG capsule Take 1 capsule (2 mg total) by mouth as needed for diarrhea or loose stools.     potassium chloride  SA (KLOR-CON  M) 20 MEQ tablet Take 1 tablet (20 mEq total) by mouth 2 (two) times daily. 30 tablet 1   saccharomyces boulardii (FLORASTOR) 250 MG capsule Take 1 capsule (250 mg total) by mouth 2 (two) times daily. 60 capsule 0   sucralfate  (CARAFATE ) 1 g tablet Take 1 tablet (1 g  total) by mouth 2 (two) times daily on an empty stomach. 60 tablet 1   triamcinolone  cream (KENALOG ) 0.1 % Apply to affected area(s) twice daily as needed 30 g 1   amLODipine  (NORVASC ) 5 MG tablet Take 1 tablet (5 mg total) by mouth daily. (Patient not taking: Reported on 12/11/2024) 90 tablet 1   [Paused] rosuvastatin  (CRESTOR ) 20 MG tablet Take 1 tablet (20 mg total) by mouth daily. (Patient not taking: Reported on 12/11/2024) 90 tablet 1   No current facility-administered medications for this visit.    HISTORY OF PRESENT ILLNESS:   Oncology History  Esophageal cancer (HCC)  09/18/2024 Cancer Staging   Staging form: Esophagus - Adenocarcinoma, AJCC 8th Edition - Clinical stage from 09/18/2024: Stage IVA (cT3, cN2, cM0, G3) - Signed by Toni Callander, MD on 10/03/2024 Histologic grading system: 3 grade system   10/03/2024 Initial Diagnosis   Esophageal cancer (HCC)   10/17/2024 - 11/16/2024 Chemotherapy   Patient is on Treatment Plan : GASTROESOPHAGEAL Durvalumab  q28d + FLOT q14d / Durvalumab  q28d     11/28/2024 -  Chemotherapy   Patient is on Treatment Plan : GASTROESOPHAGEAL FOLFOX + Nivolumab  q14d         REVIEW OF SYSTEMS:   Constitutional: Denies fevers, chills or abnormal weight loss. Continues to have decreased appetite.  Eyes: Denies blurriness of vision Ears, nose, mouth, throat, and face: Denies mucositis or sore throat Respiratory: Denies cough, dyspnea or wheezes Cardiovascular: Denies palpitation, chest discomfort or lower extremity swelling Gastrointestinal:  experiences nausea and diarrhea. Skin: Denies abnormal skin rashes Lymphatics: Denies new lymphadenopathy or easy bruising Neurological:Denies numbness, tingling or new weaknesses. She experienced significant peripheral neuropathy after initial cycle FOLFOX and nivolumab .  Behavioral/Psych: Mood is stable, no new changes  All other systems were reviewed with the patient and are negative.   VITALS:   Today's Vitals    12/11/24 0837  BP: 132/75  Pulse: 70  Resp: 17  Temp: (!) 97.2 F (36.2 C)  SpO2: 99%  Weight: 178 lb 3.2 oz (80.8 kg)  PainSc: 0-No pain   Body mass index is 24.17 kg/m.    Wt Readings from Last 3 Encounters:  12/11/24 178 lb 3.2 oz (80.8 kg)  11/28/24 172 lb 12 oz (78.4 kg)  11/15/24 179 lb 12 oz (81.5 kg)    Body mass index is 24.17 kg/m.  Performance status (ECOG): 2 - Symptomatic, <50% confined to bed  PHYSICAL EXAM:   GENERAL:alert, no distress and comfortable SKIN: skin color, texture, turgor are normal, no rashes or significant lesions EYES: normal, Conjunctiva are pink and non-injected, sclera clear OROPHARYNX:no exudate, no erythema and lips, buccal mucosa, and tongue normal  NECK: supple, thyroid  normal size, non-tender, without nodularity LYMPH:  no palpable lymphadenopathy in the cervical, axillary or inguinal LUNGS: clear to auscultation and percussion with normal breathing effort HEART: regular rate &  rhythm and no murmurs and no lower extremity edema ABDOMEN:abdomen soft and normal bowel sounds. Mild, generalized tenderness around suprapubic area of the abdomen.  Musculoskeletal:no cyanosis of digits and no clubbing  NEURO: alert & oriented x 3 with fluent speech, no focal motor/sensory deficits  LABORATORY DATA:  I have reviewed the data as listed    Component Value Date/Time   NA 141 12/11/2024 0816   K 3.1 (L) 12/11/2024 0816   CL 106 12/11/2024 0816   CO2 23 12/11/2024 0816   GLUCOSE 105 (H) 12/11/2024 0816   BUN 11 12/11/2024 0816   CREATININE 1.10 (H) 12/11/2024 0816   CALCIUM  8.2 (L) 12/11/2024 0816   PROT 6.2 (L) 12/11/2024 0816   ALBUMIN 3.5 12/11/2024 0816   AST 17 12/11/2024 0816   ALT 13 12/11/2024 0816   ALKPHOS 68 12/11/2024 0816   BILITOT 0.4 12/11/2024 0816   GFRNONAA 59 (L) 12/11/2024 0816   Lab Results  Component Value Date   WBC 4.3 12/11/2024   NEUTROABS 1.9 12/11/2024   HGB 10.9 (L) 12/11/2024   HCT 31.6 (L)  12/11/2024   MCV 90.8 12/11/2024   PLT 256 12/11/2024     "

## 2024-12-11 ENCOUNTER — Inpatient Hospital Stay

## 2024-12-11 ENCOUNTER — Encounter: Payer: Self-pay | Admitting: Nurse Practitioner

## 2024-12-11 ENCOUNTER — Other Ambulatory Visit (HOSPITAL_BASED_OUTPATIENT_CLINIC_OR_DEPARTMENT_OTHER): Payer: Self-pay

## 2024-12-11 ENCOUNTER — Inpatient Hospital Stay: Admitting: Nurse Practitioner

## 2024-12-11 VITALS — BP 132/75 | HR 70 | Temp 97.2°F | Resp 17 | Wt 178.2 lb

## 2024-12-11 DIAGNOSIS — C155 Malignant neoplasm of lower third of esophagus: Secondary | ICD-10-CM

## 2024-12-11 DIAGNOSIS — D649 Anemia, unspecified: Secondary | ICD-10-CM

## 2024-12-11 LAB — CMP (CANCER CENTER ONLY)
ALT: 13 U/L (ref 0–44)
AST: 17 U/L (ref 15–41)
Albumin: 3.5 g/dL (ref 3.5–5.0)
Alkaline Phosphatase: 68 U/L (ref 38–126)
Anion gap: 13 (ref 5–15)
BUN: 11 mg/dL (ref 6–20)
CO2: 23 mmol/L (ref 22–32)
Calcium: 8.2 mg/dL — ABNORMAL LOW (ref 8.9–10.3)
Chloride: 106 mmol/L (ref 98–111)
Creatinine: 1.1 mg/dL — ABNORMAL HIGH (ref 0.44–1.00)
GFR, Estimated: 59 mL/min — ABNORMAL LOW
Glucose, Bld: 105 mg/dL — ABNORMAL HIGH (ref 70–99)
Potassium: 3.1 mmol/L — ABNORMAL LOW (ref 3.5–5.1)
Sodium: 141 mmol/L (ref 135–145)
Total Bilirubin: 0.4 mg/dL (ref 0.0–1.2)
Total Protein: 6.2 g/dL — ABNORMAL LOW (ref 6.5–8.1)

## 2024-12-11 LAB — CBC WITH DIFFERENTIAL (CANCER CENTER ONLY)
Abs Immature Granulocytes: 0.01 10*3/uL (ref 0.00–0.07)
Basophils Absolute: 0 10*3/uL (ref 0.0–0.1)
Basophils Relative: 1 %
Eosinophils Absolute: 0.2 10*3/uL (ref 0.0–0.5)
Eosinophils Relative: 4 %
HCT: 31.6 % — ABNORMAL LOW (ref 36.0–46.0)
Hemoglobin: 10.9 g/dL — ABNORMAL LOW (ref 12.0–15.0)
Immature Granulocytes: 0 %
Lymphocytes Relative: 40 %
Lymphs Abs: 1.7 10*3/uL (ref 0.7–4.0)
MCH: 31.3 pg (ref 26.0–34.0)
MCHC: 34.5 g/dL (ref 30.0–36.0)
MCV: 90.8 fL (ref 80.0–100.0)
Monocytes Absolute: 0.5 10*3/uL (ref 0.1–1.0)
Monocytes Relative: 11 %
Neutro Abs: 1.9 10*3/uL (ref 1.7–7.7)
Neutrophils Relative %: 44 %
Platelet Count: 256 10*3/uL (ref 150–400)
RBC: 3.48 MIL/uL — ABNORMAL LOW (ref 3.87–5.11)
RDW: 14.9 % (ref 11.5–15.5)
WBC Count: 4.3 10*3/uL (ref 4.0–10.5)
nRBC: 0 % (ref 0.0–0.2)

## 2024-12-11 LAB — FERRITIN: Ferritin: 214 ng/mL (ref 11–307)

## 2024-12-11 LAB — CEA (ACCESS): CEA (CHCC): 9.5 ng/mL — ABNORMAL HIGH (ref 0.00–5.00)

## 2024-12-11 LAB — MAGNESIUM: Magnesium: 1.9 mg/dL (ref 1.7–2.4)

## 2024-12-11 MED ORDER — GABAPENTIN 100 MG PO CAPS
100.0000 mg | ORAL_CAPSULE | Freq: Two times a day (BID) | ORAL | 0 refills | Status: AC
  Filled 2024-12-11: qty 60, 30d supply, fill #0

## 2024-12-12 ENCOUNTER — Encounter: Payer: Self-pay | Admitting: General Practice

## 2024-12-12 ENCOUNTER — Other Ambulatory Visit: Payer: Self-pay

## 2024-12-12 ENCOUNTER — Inpatient Hospital Stay

## 2024-12-12 VITALS — BP 136/92 | HR 73 | Temp 97.1°F | Resp 16

## 2024-12-12 DIAGNOSIS — C155 Malignant neoplasm of lower third of esophagus: Secondary | ICD-10-CM

## 2024-12-12 MED ORDER — SODIUM CHLORIDE 0.9 % IV SOLN
240.0000 mg | Freq: Once | INTRAVENOUS | Status: AC
  Administered 2024-12-12: 240 mg via INTRAVENOUS
  Filled 2024-12-12: qty 24

## 2024-12-12 MED ORDER — POTASSIUM CHLORIDE 10 MEQ/100ML IV SOLN
10.0000 meq | Freq: Once | INTRAVENOUS | Status: AC
  Administered 2024-12-12: 10 meq via INTRAVENOUS
  Filled 2024-12-12: qty 100

## 2024-12-12 MED ORDER — LEUCOVORIN CALCIUM INJECTION 350 MG
400.0000 mg/m2 | Freq: Once | INTRAVENOUS | Status: AC
  Administered 2024-12-12: 812 mg via INTRAVENOUS
  Filled 2024-12-12: qty 40.6

## 2024-12-12 MED ORDER — DEXTROSE 5 % IV SOLN: INTRAVENOUS | Status: DC

## 2024-12-12 MED ORDER — SODIUM CHLORIDE 0.9 % IV SOLN: INTRAVENOUS | Status: DC

## 2024-12-12 MED ORDER — PALONOSETRON HCL INJECTION 0.25 MG/5ML
0.2500 mg | Freq: Once | INTRAVENOUS | Status: AC
  Administered 2024-12-12: 0.25 mg via INTRAVENOUS
  Filled 2024-12-12: qty 5

## 2024-12-12 MED ORDER — OXALIPLATIN CHEMO INJECTION 100 MG/20ML
85.0000 mg/m2 | Freq: Once | INTRAVENOUS | Status: AC
  Administered 2024-12-12: 175 mg via INTRAVENOUS
  Filled 2024-12-12: qty 15

## 2024-12-12 MED ORDER — SODIUM CHLORIDE 0.9 % IV SOLN
2400.0000 mg/m2 | INTRAVENOUS | Status: DC
  Administered 2024-12-12: 5000 mg via INTRAVENOUS
  Filled 2024-12-12: qty 100

## 2024-12-12 MED ORDER — DEXAMETHASONE SOD PHOSPHATE PF 10 MG/ML IJ SOLN
10.0000 mg | Freq: Once | INTRAMUSCULAR | Status: AC
  Administered 2024-12-12: 10 mg via INTRAVENOUS
  Filled 2024-12-12: qty 1

## 2024-12-12 NOTE — Progress Notes (Signed)
 Per Dr. Lanny, okay to run home pump over 44 hours.

## 2024-12-12 NOTE — Progress Notes (Signed)
 [facility] Spiritual Care Note  Met Toni Dougherty briefly in the [facility] Room (infusion subwait) to introduce Spiritual Care as part of her support team, per referral from Social Work. She is aware of ongoing chaplain availability and has direct Spiritual Care number, planning to reach out as needed/desired.  [address] [place], [place], [facility] Pager [phone] Voicemail [phone]

## 2024-12-12 NOTE — Patient Instructions (Signed)
 [facility] - A DEPT OF [facility]  Discharge Instructions: Thank you for choosing [facility] to provide your oncology and hematology care.   If you have a lab appointment with the Cancer Center, please go directly to the Cancer Center and check in at the registration area.   Wear comfortable clothing and clothing appropriate for easy access to any Portacath or PICC line.   We strive to give you quality time with your provider. You may need to reschedule your appointment if you arrive late (15 or more minutes).  Arriving late affects you and other patients whose appointments are after yours.  Also, if you miss three or more appointments without notifying the office, you may be dismissed from the clinic at the providers discretion.      For prescription refill requests, have your pharmacy contact our office and allow 72 hours for refills to be completed.    Today you received the following chemotherapy and/or immunotherapy agents: Nivolumab  (Opdivo ), Oxaliplatin  (Eloxatin ), Leucovorin , & Fluorouracil  (Adrucil )       To help prevent nausea and vomiting after your treatment, we encourage you to take your nausea medication as directed.  BELOW ARE SYMPTOMS THAT SHOULD BE REPORTED IMMEDIATELY: *FEVER GREATER THAN 100.4 F (38 C) OR HIGHER *CHILLS OR SWEATING *NAUSEA AND VOMITING THAT IS NOT CONTROLLED WITH YOUR NAUSEA MEDICATION *UNUSUAL SHORTNESS OF BREATH *UNUSUAL BRUISING OR BLEEDING *URINARY PROBLEMS (pain or burning when urinating, or frequent urination) *BOWEL PROBLEMS (unusual diarrhea, constipation, pain near the anus) TENDERNESS IN MOUTH AND THROAT WITH OR WITHOUT PRESENCE OF ULCERS (sore throat, sores in mouth, or a toothache) UNUSUAL RASH, SWELLING OR PAIN  UNUSUAL VAGINAL DISCHARGE OR ITCHING   Items with * indicate a potential emergency and should be followed up as soon as possible or go to the Emergency Department if any problems  should occur.  Please show the CHEMOTHERAPY ALERT CARD or IMMUNOTHERAPY ALERT CARD at check-in to the Emergency Department and triage nurse.  Should you have questions after your visit or need to cancel or reschedule your appointment, please contact [facility] - A DEPT [facility]  Dept: [phone]  and follow the prompts.  Office hours are 8:00 a.m. to 4:30 p.m. Monday - Friday. Please note that voicemails left after 4:00 p.m. may not be returned until the following business day.  We are closed weekends and major holidays. You have access to a nurse at all times for urgent questions. Please call the main number to the clinic Dept: [phone] and follow the prompts.   For any non-urgent questions, you may also contact your provider using MyChart. We now offer e-Visits for anyone 62 and older to request care online for non-urgent symptoms. For details visit [email or web address].   Also download the MyChart app! Go to the app store, search MyChart, open the app, select [place], and log in with your MyChart username and password.

## 2024-12-13 ENCOUNTER — Inpatient Hospital Stay

## 2024-12-13 ENCOUNTER — Inpatient Hospital Stay: Admitting: Nutrition

## 2024-12-13 ENCOUNTER — Inpatient Hospital Stay: Admitting: Nurse Practitioner

## 2024-12-14 ENCOUNTER — Inpatient Hospital Stay

## 2024-12-14 VITALS — BP 135/95 | HR 70 | Temp 98.5°F | Resp 16

## 2024-12-14 DIAGNOSIS — C155 Malignant neoplasm of lower third of esophagus: Secondary | ICD-10-CM

## 2024-12-14 MED ORDER — SODIUM CHLORIDE 0.9% FLUSH
10.0000 mL | INTRAVENOUS | Status: DC | PRN
  Administered 2024-12-14: 10 mL

## 2024-12-19 ENCOUNTER — Inpatient Hospital Stay: Admitting: Dietician

## 2024-12-19 ENCOUNTER — Inpatient Hospital Stay: Admitting: Licensed Clinical Social Worker

## 2024-12-19 DIAGNOSIS — C155 Malignant neoplasm of lower third of esophagus: Secondary | ICD-10-CM

## 2024-12-19 NOTE — Progress Notes (Signed)
 [facility] CSW Counseling Note  Patient was referred by self. Treatment type: Individual  Presenting Concerns: Patient and/or family reports the following symptoms/concerns: anxiety and depression Duration of problem: 2 months; Severity of problem: moderate   Orientation:oriented to person, place, time/date, situation, day of week, month of year, and year.   Affect: Appropriate and Congruent Risk of harm to self or others: No plan to harm self or others  Patient and/or Family's Strengths/Protective Factors: Social connections, Social and Emotional competence, Concrete supports in place (healthy food, safe environments, etc.), and Physical Health (exercise, healthy diet, medication compliance, etc.)Ability for insight  Capable of independent living  Communication skills  Motivation for treatment/growth  Physical Health  Supportive family/friends      Goals Addressed: Patient will:  Reduce symptoms of: anxiety and depression Increase knowledge and/or ability of: coping skills and self-management skills  Increase healthy adjustment to current life circumstances   Progress towards Goals: Initial   Interventions: Interventions utilized:  Solution-Focused Strategies, Supportive Counseling, and Supportive Reflection       12/12/2024    9:45 AM 12/11/2024    8:37 AM 11/28/2024    9:30 AM  PHQ9 SCORE ONLY  PHQ-9 Total Score 0 0 0        No data to display             Assessment: Patient currently experiencing anxiety and depression following cancer diagnosis.  Pt recently received initial diagnosis w/ f/u diagnostics indicating metastatic disease.  Pt w/ significant Pmhx of complex trauma.  Pt reporting she has been doing much better over the past two weeks emotionally.  Per pt she is feeling more comfortable w/ treatment and is encouraged by physical signs that treatment is having a positive effect.  Anxiety remains regarding the next scan for confirmation.  Pt has moved in with a  friend and reports the environment is much better for her to heal in.  Complex trauma discussed in general terms.  EMDR therapy introduced to pt as a possible mode to start to process some of her complex trauma.  Link for EMDR therapy to more comprehensively explain the technique emailed to pt.  Space allowed for pt to express her feelings regarding current life circumstances.  Emotional support provided.        Plan: Follow up with CSW: pt to f/u w/ CSW in 2 weeks         Devere JONELLE Manna, LCSW

## 2024-12-26 ENCOUNTER — Inpatient Hospital Stay

## 2024-12-26 ENCOUNTER — Inpatient Hospital Stay: Admitting: Hematology

## 2024-12-26 ENCOUNTER — Inpatient Hospital Stay: Admitting: Dietician

## 2024-12-27 ENCOUNTER — Inpatient Hospital Stay: Admitting: Nurse Practitioner

## 2024-12-27 ENCOUNTER — Inpatient Hospital Stay

## 2024-12-27 ENCOUNTER — Inpatient Hospital Stay: Admitting: Dietician

## 2024-12-28 ENCOUNTER — Inpatient Hospital Stay

## 2025-01-02 ENCOUNTER — Inpatient Hospital Stay: Admitting: Licensed Clinical Social Worker

## 2025-01-09 ENCOUNTER — Inpatient Hospital Stay

## 2025-01-09 ENCOUNTER — Inpatient Hospital Stay: Admitting: Nurse Practitioner

## 2025-01-10 ENCOUNTER — Inpatient Hospital Stay: Admitting: Nutrition

## 2025-01-10 ENCOUNTER — Inpatient Hospital Stay: Admitting: Nurse Practitioner

## 2025-01-10 ENCOUNTER — Inpatient Hospital Stay

## 2025-01-11 ENCOUNTER — Inpatient Hospital Stay

## 2025-01-23 ENCOUNTER — Inpatient Hospital Stay: Admitting: Nurse Practitioner

## 2025-01-23 ENCOUNTER — Inpatient Hospital Stay

## 2025-01-24 ENCOUNTER — Inpatient Hospital Stay

## 2025-01-24 ENCOUNTER — Inpatient Hospital Stay: Admitting: Nurse Practitioner

## 2025-01-25 ENCOUNTER — Inpatient Hospital Stay

## 2025-02-06 ENCOUNTER — Inpatient Hospital Stay: Admitting: Hematology

## 2025-02-06 ENCOUNTER — Inpatient Hospital Stay

## 2025-02-08 ENCOUNTER — Inpatient Hospital Stay
